# Patient Record
Sex: Female | Born: 1940
Health system: Southern US, Community
[De-identification: ages and names within clinical notes are randomized; demographics above are authoritative.]

## PROBLEM LIST (undated history)

## (undated) DIAGNOSIS — I10 Essential (primary) hypertension: Secondary | ICD-10-CM

## (undated) DIAGNOSIS — E039 Hypothyroidism, unspecified: Secondary | ICD-10-CM

## (undated) DIAGNOSIS — F329 Major depressive disorder, single episode, unspecified: Secondary | ICD-10-CM

## (undated) DIAGNOSIS — N3281 Overactive bladder: Secondary | ICD-10-CM

## (undated) DIAGNOSIS — K219 Gastro-esophageal reflux disease without esophagitis: Secondary | ICD-10-CM

## (undated) DIAGNOSIS — D72819 Decreased white blood cell count, unspecified: Secondary | ICD-10-CM

## (undated) DIAGNOSIS — E538 Deficiency of other specified B group vitamins: Secondary | ICD-10-CM

## (undated) DIAGNOSIS — F419 Anxiety disorder, unspecified: Secondary | ICD-10-CM

## (undated) DIAGNOSIS — D509 Iron deficiency anemia, unspecified: Secondary | ICD-10-CM

## (undated) DIAGNOSIS — M1990 Unspecified osteoarthritis, unspecified site: Secondary | ICD-10-CM

## (undated) DIAGNOSIS — R42 Dizziness and giddiness: Secondary | ICD-10-CM

## (undated) DIAGNOSIS — E079 Disorder of thyroid, unspecified: Secondary | ICD-10-CM

## (undated) DIAGNOSIS — K2100 Gastro-esophageal reflux disease with esophagitis, without bleeding: Secondary | ICD-10-CM

## (undated) HISTORY — PX: KNEE SURGERY: SHX244

## (undated) HISTORY — DX: Essential (primary) hypertension: I10

## (undated) HISTORY — DX: Major depressive disorder, single episode, unspecified: F32.9

## (undated) HISTORY — DX: Deficiency of other specified B group vitamins: E53.8

## (undated) HISTORY — DX: Dizziness and giddiness: R42

## (undated) HISTORY — PX: VAGINAL HYSTERECTOMY: SHX2639

## (undated) HISTORY — PX: REDUCTION MAMMAPLASTY: SUR839

## (undated) HISTORY — PX: APPENDECTOMY: SHX54

## (undated) HISTORY — PX: DIAGNOSTIC LAPAROSCOPY: SUR761

## (undated) HISTORY — DX: Gastro-esophageal reflux disease with esophagitis, without bleeding: K21.00

## (undated) HISTORY — DX: Overactive bladder: N32.81

## (undated) HISTORY — PX: EYE SURGERY: SHX253

## (undated) HISTORY — DX: Iron deficiency anemia, unspecified: D50.9

## (undated) HISTORY — PX: TONSILLECTOMY: SUR1361

## (undated) HISTORY — DX: Decreased white blood cell count, unspecified: D72.819

---

## 1988-01-28 HISTORY — PX: REDUCTION MAMMAPLASTY: SUR839

## 2005-01-27 HISTORY — PX: HIATAL HERNIA REPAIR: SHX195

## 2007-01-28 HISTORY — PX: BACK SURGERY: SHX140

## 2017-02-26 ENCOUNTER — Other Ambulatory Visit (HOSPITAL_COMMUNITY): Payer: Self-pay | Admitting: Internal Medicine

## 2017-02-26 DIAGNOSIS — Z1231 Encounter for screening mammogram for malignant neoplasm of breast: Secondary | ICD-10-CM

## 2017-02-26 DIAGNOSIS — Z780 Asymptomatic menopausal state: Secondary | ICD-10-CM

## 2017-03-09 ENCOUNTER — Ambulatory Visit (HOSPITAL_COMMUNITY)
Admission: RE | Admit: 2017-03-09 | Discharge: 2017-03-09 | Disposition: A | Discharge status: Home / Self Care | Payer: 59 | Source: Ambulatory Visit | Attending: Internal Medicine | Admitting: Internal Medicine

## 2017-03-09 ENCOUNTER — Encounter (HOSPITAL_COMMUNITY): Payer: Self-pay | Admitting: Radiology

## 2017-03-09 DIAGNOSIS — Z780 Asymptomatic menopausal state: Secondary | ICD-10-CM | POA: Diagnosis present

## 2017-03-09 DIAGNOSIS — M85851 Other specified disorders of bone density and structure, right thigh: Secondary | ICD-10-CM | POA: Diagnosis not present

## 2017-03-09 DIAGNOSIS — Z1231 Encounter for screening mammogram for malignant neoplasm of breast: Secondary | ICD-10-CM | POA: Diagnosis not present

## 2017-10-06 ENCOUNTER — Ambulatory Visit (HOSPITAL_COMMUNITY)
Admission: RE | Admit: 2017-10-06 | Discharge: 2017-10-06 | Disposition: A | Discharge status: Home / Self Care | Payer: 59 | Source: Ambulatory Visit | Attending: Adult Health Nurse Practitioner | Admitting: Adult Health Nurse Practitioner

## 2017-10-06 ENCOUNTER — Other Ambulatory Visit (HOSPITAL_COMMUNITY): Payer: Self-pay | Admitting: Adult Health Nurse Practitioner

## 2017-10-06 DIAGNOSIS — M11262 Other chondrocalcinosis, left knee: Secondary | ICD-10-CM | POA: Insufficient documentation

## 2017-10-06 DIAGNOSIS — M25562 Pain in left knee: Secondary | ICD-10-CM

## 2017-10-06 DIAGNOSIS — M1712 Unilateral primary osteoarthritis, left knee: Secondary | ICD-10-CM | POA: Diagnosis not present

## 2017-10-06 DIAGNOSIS — M7122 Synovial cyst of popliteal space [Baker], left knee: Secondary | ICD-10-CM | POA: Diagnosis present

## 2018-01-13 ENCOUNTER — Other Ambulatory Visit (HOSPITAL_COMMUNITY): Payer: Self-pay | Admitting: Adult Health Nurse Practitioner

## 2018-01-13 ENCOUNTER — Ambulatory Visit (HOSPITAL_COMMUNITY)
Admission: RE | Admit: 2018-01-13 | Discharge: 2018-01-13 | Disposition: A | Discharge status: Home / Self Care | Payer: 59 | Source: Ambulatory Visit | Attending: Adult Health Nurse Practitioner | Admitting: Adult Health Nurse Practitioner

## 2018-01-13 DIAGNOSIS — R05 Cough: Secondary | ICD-10-CM | POA: Diagnosis present

## 2018-01-13 DIAGNOSIS — R059 Cough, unspecified: Secondary | ICD-10-CM

## 2018-01-28 ENCOUNTER — Ambulatory Visit (HOSPITAL_COMMUNITY)
Admission: RE | Admit: 2018-01-28 | Discharge: 2018-01-28 | Disposition: A | Discharge status: Home / Self Care | Payer: 59 | Source: Ambulatory Visit | Attending: Adult Health Nurse Practitioner | Admitting: Adult Health Nurse Practitioner

## 2018-01-28 ENCOUNTER — Other Ambulatory Visit (HOSPITAL_COMMUNITY): Payer: Self-pay | Admitting: Adult Health Nurse Practitioner

## 2018-01-28 DIAGNOSIS — J189 Pneumonia, unspecified organism: Secondary | ICD-10-CM | POA: Insufficient documentation

## 2018-02-04 ENCOUNTER — Other Ambulatory Visit (HOSPITAL_COMMUNITY): Payer: Self-pay | Admitting: Adult Health Nurse Practitioner

## 2018-02-04 DIAGNOSIS — Z1231 Encounter for screening mammogram for malignant neoplasm of breast: Secondary | ICD-10-CM

## 2018-03-10 ENCOUNTER — Ambulatory Visit (HOSPITAL_COMMUNITY)
Admission: RE | Admit: 2018-03-10 | Discharge: 2018-03-10 | Disposition: A | Discharge status: Home / Self Care | Payer: 59 | Source: Ambulatory Visit | Attending: Adult Health Nurse Practitioner | Admitting: Adult Health Nurse Practitioner

## 2018-03-10 DIAGNOSIS — Z1231 Encounter for screening mammogram for malignant neoplasm of breast: Secondary | ICD-10-CM

## 2018-07-13 DIAGNOSIS — E039 Hypothyroidism, unspecified: Secondary | ICD-10-CM | POA: Diagnosis not present

## 2018-07-13 DIAGNOSIS — E782 Mixed hyperlipidemia: Secondary | ICD-10-CM | POA: Diagnosis not present

## 2018-08-10 DIAGNOSIS — H26491 Other secondary cataract, right eye: Secondary | ICD-10-CM | POA: Diagnosis not present

## 2018-08-18 DIAGNOSIS — K219 Gastro-esophageal reflux disease without esophagitis: Secondary | ICD-10-CM | POA: Diagnosis not present

## 2018-08-18 DIAGNOSIS — E785 Hyperlipidemia, unspecified: Secondary | ICD-10-CM | POA: Diagnosis not present

## 2018-08-18 DIAGNOSIS — E039 Hypothyroidism, unspecified: Secondary | ICD-10-CM | POA: Diagnosis not present

## 2018-09-13 DIAGNOSIS — R030 Elevated blood-pressure reading, without diagnosis of hypertension: Secondary | ICD-10-CM | POA: Diagnosis not present

## 2018-09-13 DIAGNOSIS — E039 Hypothyroidism, unspecified: Secondary | ICD-10-CM | POA: Diagnosis not present

## 2018-09-13 DIAGNOSIS — I10 Essential (primary) hypertension: Secondary | ICD-10-CM | POA: Diagnosis not present

## 2018-09-13 DIAGNOSIS — E782 Mixed hyperlipidemia: Secondary | ICD-10-CM | POA: Diagnosis not present

## 2018-09-13 DIAGNOSIS — E785 Hyperlipidemia, unspecified: Secondary | ICD-10-CM | POA: Diagnosis not present

## 2018-09-17 DIAGNOSIS — E039 Hypothyroidism, unspecified: Secondary | ICD-10-CM | POA: Diagnosis not present

## 2018-09-17 DIAGNOSIS — E785 Hyperlipidemia, unspecified: Secondary | ICD-10-CM | POA: Diagnosis not present

## 2018-09-17 DIAGNOSIS — K219 Gastro-esophageal reflux disease without esophagitis: Secondary | ICD-10-CM | POA: Diagnosis not present

## 2018-09-21 DIAGNOSIS — K219 Gastro-esophageal reflux disease without esophagitis: Secondary | ICD-10-CM | POA: Diagnosis not present

## 2018-09-21 DIAGNOSIS — E782 Mixed hyperlipidemia: Secondary | ICD-10-CM | POA: Diagnosis not present

## 2018-09-21 DIAGNOSIS — E039 Hypothyroidism, unspecified: Secondary | ICD-10-CM | POA: Diagnosis not present

## 2018-09-21 DIAGNOSIS — I10 Essential (primary) hypertension: Secondary | ICD-10-CM | POA: Diagnosis not present

## 2018-09-21 DIAGNOSIS — Z0001 Encounter for general adult medical examination with abnormal findings: Secondary | ICD-10-CM | POA: Diagnosis not present

## 2018-09-27 DIAGNOSIS — M25562 Pain in left knee: Secondary | ICD-10-CM | POA: Diagnosis not present

## 2018-11-02 DIAGNOSIS — E785 Hyperlipidemia, unspecified: Secondary | ICD-10-CM | POA: Diagnosis not present

## 2018-11-02 DIAGNOSIS — E039 Hypothyroidism, unspecified: Secondary | ICD-10-CM | POA: Diagnosis not present

## 2018-11-02 DIAGNOSIS — K219 Gastro-esophageal reflux disease without esophagitis: Secondary | ICD-10-CM | POA: Diagnosis not present

## 2018-11-18 DIAGNOSIS — H26493 Other secondary cataract, bilateral: Secondary | ICD-10-CM | POA: Diagnosis not present

## 2018-11-18 DIAGNOSIS — Z961 Presence of intraocular lens: Secondary | ICD-10-CM | POA: Diagnosis not present

## 2018-11-18 DIAGNOSIS — H26492 Other secondary cataract, left eye: Secondary | ICD-10-CM | POA: Diagnosis not present

## 2019-01-05 DIAGNOSIS — H5213 Myopia, bilateral: Secondary | ICD-10-CM | POA: Diagnosis not present

## 2019-01-11 DIAGNOSIS — E039 Hypothyroidism, unspecified: Secondary | ICD-10-CM | POA: Diagnosis not present

## 2019-01-11 DIAGNOSIS — M25562 Pain in left knee: Secondary | ICD-10-CM | POA: Diagnosis not present

## 2019-01-24 DIAGNOSIS — H524 Presbyopia: Secondary | ICD-10-CM | POA: Diagnosis not present

## 2019-01-24 DIAGNOSIS — H52223 Regular astigmatism, bilateral: Secondary | ICD-10-CM | POA: Diagnosis not present

## 2019-01-31 DIAGNOSIS — E785 Hyperlipidemia, unspecified: Secondary | ICD-10-CM | POA: Diagnosis not present

## 2019-03-01 ENCOUNTER — Other Ambulatory Visit (HOSPITAL_COMMUNITY): Payer: Self-pay | Admitting: Adult Health Nurse Practitioner

## 2019-03-01 DIAGNOSIS — Z1231 Encounter for screening mammogram for malignant neoplasm of breast: Secondary | ICD-10-CM

## 2019-03-02 ENCOUNTER — Other Ambulatory Visit (HOSPITAL_COMMUNITY): Payer: Self-pay | Admitting: Internal Medicine

## 2019-03-02 DIAGNOSIS — Z13820 Encounter for screening for osteoporosis: Secondary | ICD-10-CM

## 2019-03-16 ENCOUNTER — Ambulatory Visit (HOSPITAL_COMMUNITY)
Admission: RE | Admit: 2019-03-16 | Discharge: 2019-03-16 | Disposition: A | Discharge status: Home / Self Care | Payer: Medicare Other | Source: Ambulatory Visit | Attending: Internal Medicine | Admitting: Internal Medicine

## 2019-03-16 ENCOUNTER — Ambulatory Visit (HOSPITAL_COMMUNITY)
Admission: RE | Admit: 2019-03-16 | Discharge: 2019-03-16 | Disposition: A | Discharge status: Home / Self Care | Payer: Medicare Other | Source: Ambulatory Visit | Attending: Adult Health Nurse Practitioner | Admitting: Adult Health Nurse Practitioner

## 2019-03-16 ENCOUNTER — Other Ambulatory Visit: Payer: Self-pay

## 2019-03-16 DIAGNOSIS — Z780 Asymptomatic menopausal state: Secondary | ICD-10-CM | POA: Diagnosis not present

## 2019-03-16 DIAGNOSIS — M8589 Other specified disorders of bone density and structure, multiple sites: Secondary | ICD-10-CM | POA: Diagnosis not present

## 2019-03-16 DIAGNOSIS — Z13820 Encounter for screening for osteoporosis: Secondary | ICD-10-CM

## 2019-03-16 DIAGNOSIS — Z1231 Encounter for screening mammogram for malignant neoplasm of breast: Secondary | ICD-10-CM | POA: Insufficient documentation

## 2019-03-21 DIAGNOSIS — Z1321 Encounter for screening for nutritional disorder: Secondary | ICD-10-CM | POA: Diagnosis not present

## 2019-03-21 DIAGNOSIS — E039 Hypothyroidism, unspecified: Secondary | ICD-10-CM | POA: Diagnosis not present

## 2019-03-21 DIAGNOSIS — E782 Mixed hyperlipidemia: Secondary | ICD-10-CM | POA: Diagnosis not present

## 2019-03-21 DIAGNOSIS — Z0001 Encounter for general adult medical examination with abnormal findings: Secondary | ICD-10-CM | POA: Diagnosis not present

## 2019-03-21 DIAGNOSIS — R030 Elevated blood-pressure reading, without diagnosis of hypertension: Secondary | ICD-10-CM | POA: Diagnosis not present

## 2019-03-22 DIAGNOSIS — E782 Mixed hyperlipidemia: Secondary | ICD-10-CM | POA: Diagnosis not present

## 2019-03-22 DIAGNOSIS — Z0001 Encounter for general adult medical examination with abnormal findings: Secondary | ICD-10-CM | POA: Diagnosis not present

## 2019-03-22 DIAGNOSIS — K219 Gastro-esophageal reflux disease without esophagitis: Secondary | ICD-10-CM | POA: Diagnosis not present

## 2019-03-22 DIAGNOSIS — E039 Hypothyroidism, unspecified: Secondary | ICD-10-CM | POA: Diagnosis not present

## 2019-03-22 DIAGNOSIS — I10 Essential (primary) hypertension: Secondary | ICD-10-CM | POA: Diagnosis not present

## 2019-04-21 DIAGNOSIS — D519 Vitamin B12 deficiency anemia, unspecified: Secondary | ICD-10-CM | POA: Diagnosis not present

## 2019-05-10 DIAGNOSIS — K219 Gastro-esophageal reflux disease without esophagitis: Secondary | ICD-10-CM | POA: Diagnosis not present

## 2019-05-10 DIAGNOSIS — E785 Hyperlipidemia, unspecified: Secondary | ICD-10-CM | POA: Diagnosis not present

## 2019-05-10 DIAGNOSIS — E039 Hypothyroidism, unspecified: Secondary | ICD-10-CM | POA: Diagnosis not present

## 2019-05-23 DIAGNOSIS — D519 Vitamin B12 deficiency anemia, unspecified: Secondary | ICD-10-CM | POA: Diagnosis not present

## 2019-06-16 DIAGNOSIS — D519 Vitamin B12 deficiency anemia, unspecified: Secondary | ICD-10-CM | POA: Diagnosis not present

## 2019-07-04 DIAGNOSIS — E785 Hyperlipidemia, unspecified: Secondary | ICD-10-CM | POA: Diagnosis not present

## 2019-07-04 DIAGNOSIS — E039 Hypothyroidism, unspecified: Secondary | ICD-10-CM | POA: Diagnosis not present

## 2019-07-04 DIAGNOSIS — E782 Mixed hyperlipidemia: Secondary | ICD-10-CM | POA: Diagnosis not present

## 2019-07-04 DIAGNOSIS — D519 Vitamin B12 deficiency anemia, unspecified: Secondary | ICD-10-CM | POA: Diagnosis not present

## 2019-07-20 DIAGNOSIS — E039 Hypothyroidism, unspecified: Secondary | ICD-10-CM | POA: Diagnosis not present

## 2019-07-20 DIAGNOSIS — E782 Mixed hyperlipidemia: Secondary | ICD-10-CM | POA: Diagnosis not present

## 2019-07-20 DIAGNOSIS — E559 Vitamin D deficiency, unspecified: Secondary | ICD-10-CM | POA: Diagnosis not present

## 2019-07-20 DIAGNOSIS — D519 Vitamin B12 deficiency anemia, unspecified: Secondary | ICD-10-CM | POA: Diagnosis not present

## 2019-07-20 DIAGNOSIS — E785 Hyperlipidemia, unspecified: Secondary | ICD-10-CM | POA: Diagnosis not present

## 2019-07-20 DIAGNOSIS — R5383 Other fatigue: Secondary | ICD-10-CM | POA: Diagnosis not present

## 2019-07-28 DIAGNOSIS — E785 Hyperlipidemia, unspecified: Secondary | ICD-10-CM | POA: Diagnosis not present

## 2019-07-28 DIAGNOSIS — K219 Gastro-esophageal reflux disease without esophagitis: Secondary | ICD-10-CM | POA: Diagnosis not present

## 2019-07-28 DIAGNOSIS — E039 Hypothyroidism, unspecified: Secondary | ICD-10-CM | POA: Diagnosis not present

## 2019-08-15 DIAGNOSIS — H02834 Dermatochalasis of left upper eyelid: Secondary | ICD-10-CM | POA: Diagnosis not present

## 2019-08-15 DIAGNOSIS — Z961 Presence of intraocular lens: Secondary | ICD-10-CM | POA: Diagnosis not present

## 2019-08-15 DIAGNOSIS — H26491 Other secondary cataract, right eye: Secondary | ICD-10-CM | POA: Diagnosis not present

## 2019-08-15 DIAGNOSIS — H02831 Dermatochalasis of right upper eyelid: Secondary | ICD-10-CM | POA: Diagnosis not present

## 2019-09-19 DIAGNOSIS — N819 Female genital prolapse, unspecified: Secondary | ICD-10-CM | POA: Diagnosis not present

## 2019-09-19 DIAGNOSIS — E782 Mixed hyperlipidemia: Secondary | ICD-10-CM | POA: Diagnosis not present

## 2019-09-19 DIAGNOSIS — R7303 Prediabetes: Secondary | ICD-10-CM | POA: Diagnosis not present

## 2019-09-19 DIAGNOSIS — R030 Elevated blood-pressure reading, without diagnosis of hypertension: Secondary | ICD-10-CM | POA: Diagnosis not present

## 2019-09-21 DIAGNOSIS — I10 Essential (primary) hypertension: Secondary | ICD-10-CM | POA: Diagnosis not present

## 2019-09-21 DIAGNOSIS — K219 Gastro-esophageal reflux disease without esophagitis: Secondary | ICD-10-CM | POA: Diagnosis not present

## 2019-09-21 DIAGNOSIS — E039 Hypothyroidism, unspecified: Secondary | ICD-10-CM | POA: Diagnosis not present

## 2019-09-21 DIAGNOSIS — E782 Mixed hyperlipidemia: Secondary | ICD-10-CM | POA: Diagnosis not present

## 2019-10-05 DIAGNOSIS — H02831 Dermatochalasis of right upper eyelid: Secondary | ICD-10-CM | POA: Diagnosis not present

## 2019-10-05 DIAGNOSIS — H02834 Dermatochalasis of left upper eyelid: Secondary | ICD-10-CM | POA: Diagnosis not present

## 2019-10-17 DIAGNOSIS — M25562 Pain in left knee: Secondary | ICD-10-CM | POA: Diagnosis not present

## 2019-10-31 DIAGNOSIS — E7849 Other hyperlipidemia: Secondary | ICD-10-CM | POA: Diagnosis not present

## 2019-10-31 DIAGNOSIS — E039 Hypothyroidism, unspecified: Secondary | ICD-10-CM | POA: Diagnosis not present

## 2019-10-31 DIAGNOSIS — K219 Gastro-esophageal reflux disease without esophagitis: Secondary | ICD-10-CM | POA: Diagnosis not present

## 2019-11-29 DIAGNOSIS — E039 Hypothyroidism, unspecified: Secondary | ICD-10-CM | POA: Diagnosis not present

## 2019-11-29 DIAGNOSIS — E7849 Other hyperlipidemia: Secondary | ICD-10-CM | POA: Diagnosis not present

## 2019-11-29 DIAGNOSIS — E785 Hyperlipidemia, unspecified: Secondary | ICD-10-CM | POA: Diagnosis not present

## 2019-11-29 DIAGNOSIS — K219 Gastro-esophageal reflux disease without esophagitis: Secondary | ICD-10-CM | POA: Diagnosis not present

## 2019-12-27 ENCOUNTER — Other Ambulatory Visit (HOSPITAL_COMMUNITY): Payer: Self-pay

## 2019-12-27 DIAGNOSIS — U071 COVID-19: Secondary | ICD-10-CM | POA: Diagnosis not present

## 2019-12-27 DIAGNOSIS — J040 Acute laryngitis: Secondary | ICD-10-CM | POA: Diagnosis not present

## 2019-12-28 ENCOUNTER — Telehealth: Payer: Self-pay | Admitting: Oncology

## 2019-12-28 NOTE — Telephone Encounter (Signed)
Called to Discuss with patient about Covid symptoms and the use of the monoclonal antibody infusion for those with mild to moderate Covid symptoms and at a high risk of hospitalization.     Pt is qualified for this infusion due to co-morbid conditions and/or a member of an at-risk group.    Mrs. Couchman is 79 years old.  There are no problems to display for this patient.  Patient declines infusion at this time. Symptoms tier reviewed as well as criteria for ending isolation. Preventative practices reviewed. Patient verbalized understanding.   Mrs. Nusbaum tested positive from rapid test at Dr. Scharlene Gloss office and [place].  She states she feels pretty good and only has laryngitis.  She does not believe she actually has COVID-19 and would like to retest.  We spoke at length about the monoclonal antibody infusion as well as the COVID-19 virus.  She will call clinic back with any questions and results of her second COVID-19 test.  Patient advised to call back if he/she decides that he/she does want to get infusion. Callback number to the infusion center given. Patient advised to go to Urgent care or ED with severe symptoms.   Durenda Hurt, NP 12/28/2019 12:25 PM

## 2019-12-30 DIAGNOSIS — U071 COVID-19: Secondary | ICD-10-CM | POA: Diagnosis not present

## 2019-12-30 DIAGNOSIS — J040 Acute laryngitis: Secondary | ICD-10-CM | POA: Diagnosis not present

## 2020-01-04 DIAGNOSIS — J040 Acute laryngitis: Secondary | ICD-10-CM | POA: Diagnosis not present

## 2020-01-10 DIAGNOSIS — N3281 Overactive bladder: Secondary | ICD-10-CM | POA: Diagnosis not present

## 2020-01-10 DIAGNOSIS — R531 Weakness: Secondary | ICD-10-CM | POA: Diagnosis not present

## 2020-01-10 DIAGNOSIS — Z8616 Personal history of COVID-19: Secondary | ICD-10-CM | POA: Diagnosis not present

## 2020-01-17 DIAGNOSIS — Z719 Counseling, unspecified: Secondary | ICD-10-CM | POA: Diagnosis not present

## 2020-01-27 DIAGNOSIS — E7849 Other hyperlipidemia: Secondary | ICD-10-CM | POA: Diagnosis not present

## 2020-01-27 DIAGNOSIS — E785 Hyperlipidemia, unspecified: Secondary | ICD-10-CM | POA: Diagnosis not present

## 2020-01-27 DIAGNOSIS — K219 Gastro-esophageal reflux disease without esophagitis: Secondary | ICD-10-CM | POA: Diagnosis not present

## 2020-01-27 DIAGNOSIS — E039 Hypothyroidism, unspecified: Secondary | ICD-10-CM | POA: Diagnosis not present

## 2020-01-30 DIAGNOSIS — H02831 Dermatochalasis of right upper eyelid: Secondary | ICD-10-CM | POA: Diagnosis not present

## 2020-01-30 DIAGNOSIS — H02834 Dermatochalasis of left upper eyelid: Secondary | ICD-10-CM | POA: Diagnosis not present

## 2020-02-06 ENCOUNTER — Other Ambulatory Visit (HOSPITAL_COMMUNITY): Payer: Self-pay | Admitting: Internal Medicine

## 2020-02-06 DIAGNOSIS — Z1231 Encounter for screening mammogram for malignant neoplasm of breast: Secondary | ICD-10-CM

## 2020-02-25 DIAGNOSIS — E7849 Other hyperlipidemia: Secondary | ICD-10-CM | POA: Diagnosis not present

## 2020-02-25 DIAGNOSIS — K219 Gastro-esophageal reflux disease without esophagitis: Secondary | ICD-10-CM | POA: Diagnosis not present

## 2020-02-25 DIAGNOSIS — E785 Hyperlipidemia, unspecified: Secondary | ICD-10-CM | POA: Diagnosis not present

## 2020-03-19 ENCOUNTER — Ambulatory Visit (HOSPITAL_COMMUNITY): Payer: Medicare Other

## 2020-03-26 DIAGNOSIS — E785 Hyperlipidemia, unspecified: Secondary | ICD-10-CM | POA: Diagnosis not present

## 2020-03-26 DIAGNOSIS — E039 Hypothyroidism, unspecified: Secondary | ICD-10-CM | POA: Diagnosis not present

## 2020-03-26 DIAGNOSIS — K219 Gastro-esophageal reflux disease without esophagitis: Secondary | ICD-10-CM | POA: Diagnosis not present

## 2020-04-25 DIAGNOSIS — E785 Hyperlipidemia, unspecified: Secondary | ICD-10-CM | POA: Diagnosis not present

## 2020-04-25 DIAGNOSIS — K219 Gastro-esophageal reflux disease without esophagitis: Secondary | ICD-10-CM | POA: Diagnosis not present

## 2020-04-25 DIAGNOSIS — E039 Hypothyroidism, unspecified: Secondary | ICD-10-CM | POA: Diagnosis not present

## 2020-04-26 ENCOUNTER — Other Ambulatory Visit: Payer: Self-pay

## 2020-04-26 ENCOUNTER — Ambulatory Visit (HOSPITAL_COMMUNITY)
Admission: RE | Admit: 2020-04-26 | Discharge: 2020-04-26 | Disposition: A | Discharge status: Home / Self Care | Payer: Medicare Other | Source: Ambulatory Visit | Attending: Internal Medicine | Admitting: Internal Medicine

## 2020-04-26 DIAGNOSIS — Z1231 Encounter for screening mammogram for malignant neoplasm of breast: Secondary | ICD-10-CM | POA: Diagnosis not present

## 2020-05-30 ENCOUNTER — Other Ambulatory Visit (HOSPITAL_COMMUNITY): Payer: Self-pay | Admitting: Adult Health Nurse Practitioner

## 2020-05-30 ENCOUNTER — Other Ambulatory Visit: Payer: Self-pay

## 2020-05-30 ENCOUNTER — Ambulatory Visit (HOSPITAL_COMMUNITY)
Admission: RE | Admit: 2020-05-30 | Discharge: 2020-05-30 | Disposition: A | Discharge status: Home / Self Care | Payer: Medicare Other | Source: Ambulatory Visit | Attending: Adult Health Nurse Practitioner | Admitting: Adult Health Nurse Practitioner

## 2020-05-30 DIAGNOSIS — M62830 Muscle spasm of back: Secondary | ICD-10-CM

## 2020-05-30 DIAGNOSIS — R7303 Prediabetes: Secondary | ICD-10-CM | POA: Diagnosis not present

## 2020-05-30 DIAGNOSIS — M546 Pain in thoracic spine: Secondary | ICD-10-CM | POA: Diagnosis not present

## 2020-05-30 DIAGNOSIS — E538 Deficiency of other specified B group vitamins: Secondary | ICD-10-CM | POA: Diagnosis not present

## 2020-05-30 DIAGNOSIS — E782 Mixed hyperlipidemia: Secondary | ICD-10-CM | POA: Diagnosis not present

## 2020-05-30 DIAGNOSIS — E039 Hypothyroidism, unspecified: Secondary | ICD-10-CM | POA: Diagnosis not present

## 2020-05-30 DIAGNOSIS — I10 Essential (primary) hypertension: Secondary | ICD-10-CM | POA: Diagnosis not present

## 2020-08-21 DIAGNOSIS — R5081 Fever presenting with conditions classified elsewhere: Secondary | ICD-10-CM | POA: Diagnosis not present

## 2020-08-21 DIAGNOSIS — I10 Essential (primary) hypertension: Secondary | ICD-10-CM | POA: Diagnosis not present

## 2020-08-21 DIAGNOSIS — M2559 Pain in other specified joint: Secondary | ICD-10-CM | POA: Diagnosis not present

## 2020-08-21 DIAGNOSIS — R531 Weakness: Secondary | ICD-10-CM | POA: Diagnosis not present

## 2020-08-21 DIAGNOSIS — R5383 Other fatigue: Secondary | ICD-10-CM | POA: Diagnosis not present

## 2020-08-21 DIAGNOSIS — Z20822 Contact with and (suspected) exposure to covid-19: Secondary | ICD-10-CM | POA: Diagnosis not present

## 2020-08-21 DIAGNOSIS — Z7689 Persons encountering health services in other specified circumstances: Secondary | ICD-10-CM | POA: Diagnosis not present

## 2020-08-21 DIAGNOSIS — K219 Gastro-esophageal reflux disease without esophagitis: Secondary | ICD-10-CM | POA: Diagnosis not present

## 2020-08-21 DIAGNOSIS — R6889 Other general symptoms and signs: Secondary | ICD-10-CM | POA: Diagnosis not present

## 2020-09-11 DIAGNOSIS — I10 Essential (primary) hypertension: Secondary | ICD-10-CM | POA: Diagnosis not present

## 2020-09-11 DIAGNOSIS — K219 Gastro-esophageal reflux disease without esophagitis: Secondary | ICD-10-CM | POA: Diagnosis not present

## 2020-09-11 DIAGNOSIS — D508 Other iron deficiency anemias: Secondary | ICD-10-CM | POA: Diagnosis not present

## 2020-09-11 DIAGNOSIS — R5383 Other fatigue: Secondary | ICD-10-CM | POA: Diagnosis not present

## 2020-09-11 DIAGNOSIS — M21611 Bunion of right foot: Secondary | ICD-10-CM | POA: Diagnosis not present

## 2020-09-20 ENCOUNTER — Encounter: Payer: Self-pay | Admitting: Podiatry

## 2020-09-20 ENCOUNTER — Other Ambulatory Visit: Payer: Self-pay

## 2020-09-20 ENCOUNTER — Ambulatory Visit (INDEPENDENT_AMBULATORY_CARE_PROVIDER_SITE_OTHER): Payer: Medicare Other | Admitting: Podiatry

## 2020-09-20 ENCOUNTER — Ambulatory Visit (INDEPENDENT_AMBULATORY_CARE_PROVIDER_SITE_OTHER): Payer: Medicare Other

## 2020-09-20 DIAGNOSIS — L600 Ingrowing nail: Secondary | ICD-10-CM | POA: Diagnosis not present

## 2020-09-20 DIAGNOSIS — M216X1 Other acquired deformities of right foot: Secondary | ICD-10-CM

## 2020-09-20 DIAGNOSIS — M216X2 Other acquired deformities of left foot: Secondary | ICD-10-CM | POA: Diagnosis not present

## 2020-09-20 DIAGNOSIS — M21619 Bunion of unspecified foot: Secondary | ICD-10-CM

## 2020-09-20 DIAGNOSIS — M205X1 Other deformities of toe(s) (acquired), right foot: Secondary | ICD-10-CM

## 2020-09-20 NOTE — Patient Instructions (Signed)

## 2020-09-20 NOTE — Progress Notes (Signed)
Subjective:   Patient ID: Kathleen Keller, female   DOB: 80 y.o.   MRN: [id number]   HPI Patient presents stating  She is having a lot of pain of the big toe joint right with bunion deformity ingrown toenail deformity left great toe over right big toe stating it sore and makes it hard to walk.  Patient states she is tried wider shoes she is tried soaks and other modalities and the bunion problem has been continuous with the ingrown toenail.  Patient does not smoke likes to be active    Review of Systems  All other systems reviewed and are negative.      Objective:  Physical Exam Vitals and nursing note reviewed.  Constitutional:      Appearance: She is well-developed.  Pulmonary:     Effort: Pulmonary effort is normal.  Musculoskeletal:        General: Normal range of motion.  Skin:    General: Skin is warm.  Neurological:     Mental Status: She is alert.    Neurovascular status intact muscle strength was found to be adequate range of motion adequate.  Patient has prominence of the first metatarsal head right with redness around the joint along with reduced range of motion spurring dorsally and discomfort of the joint surface with no crepitus.  Left shows an incurvated medial border painful when pressed make shoe gear difficult and has tried trimming it and soaking it.  Good digital perfusion well oriented x3     Assessment:  Structural HAV hallux limitus deformity right with prominence and reduced motion with pain along with ingrown toenail deformity left hallux     Plan:  H&P educated on both different conditions and discussed correction of the ingrown toenail today which she wants done.  I allowed her to read consent form she signed I infiltrated the left hallux 60 mg like Marcaine mixture under sterile conditions remove the medial border exposed matrix and applied phenol 3 applications 30 seconds followed by alcohol lavage sterile dressing.  For the right I discussed the bunion  deformity I recommended correction she wants to get it done since she has to drive quite a bit distance she wants to do consent form today.  I allowed her to sign consent form explaining procedure risk and patient scheduled for outpatient surgery after reviewing alternative treatments complications.  She will have a biplanar osteotomy first metatarsal right with removal of bone spur formation and removal of the medial eminence and is scheduled an air fracture walker dispensed that I want her to get used to prior to surgery and will bring with her and is encouraged to call questions concerns.  Total recovery to take 6 months ultimate fusion of the first metatarsal phalangeal joint or implant cannot be ruled out  X-rays indicate there is spurring of the dorsal first metatarsal elevation intermetatarsal angle approximately 15 degrees with medial eminence.  Moderate reduced range of motion and joint space first MPJ

## 2020-09-24 ENCOUNTER — Telehealth: Payer: Self-pay | Admitting: Urology

## 2020-09-24 NOTE — Telephone Encounter (Signed)
DOS - 10/09/20  [place] BUNIONECTOMY RIGHT --- [phone]   [facility] EFFECTIVE DATE - 01/28/20   PLAN DEDUCTIBLE - $0.00 OUT OF POCKET - $7,550.00 W/ $7,550.00 REMAINING COINSURANCE - 20% COPAY - $0.00   SPOKE WITH MARY WITH [employer] AND SHE STATED THAT FOR CPT CODE 92119 NO PRIOR AUTH IS REQUIRED.   REF # [id number]

## 2020-10-04 ENCOUNTER — Encounter (HOSPITAL_COMMUNITY)
Admission: RE | Admit: 2020-10-04 | Discharge: 2020-10-04 | Disposition: A | Discharge status: Home / Self Care | Payer: Medicare Other | Source: Ambulatory Visit | Attending: Adult Health Nurse Practitioner | Admitting: Adult Health Nurse Practitioner

## 2020-10-04 ENCOUNTER — Other Ambulatory Visit: Payer: Self-pay

## 2020-10-04 DIAGNOSIS — D509 Iron deficiency anemia, unspecified: Secondary | ICD-10-CM | POA: Insufficient documentation

## 2020-10-04 MED ORDER — SODIUM CHLORIDE 0.9 % IV SOLN: INTRAVENOUS | Status: DC

## 2020-10-04 MED ORDER — SODIUM CHLORIDE 0.9 % IV SOLN
750.0000 mg | Freq: Once | INTRAVENOUS | Status: AC
  Administered 2020-10-04: 750 mg via INTRAVENOUS
  Filled 2020-10-04: qty 15

## 2020-10-04 NOTE — Progress Notes (Signed)
Tolerated injectafer infusing well. Voices no c/o at this time. D/C to home in good condition.

## 2020-10-08 MED ORDER — ONDANSETRON HCL 4 MG PO TABS: 4.0000 mg | ORAL_TABLET | Freq: Three times a day (TID) | ORAL | 0 refills | Status: DC | PRN

## 2020-10-08 MED ORDER — OXYCODONE-ACETAMINOPHEN 10-325 MG PO TABS: 1.0000 | ORAL_TABLET | ORAL | 0 refills | Status: DC | PRN

## 2020-10-08 NOTE — Addendum Note (Signed)
Addended by: Lenn Sink on: 10/08/2020 05:04 PM   Modules accepted: Orders

## 2020-10-09 ENCOUNTER — Encounter: Payer: Self-pay | Admitting: Podiatry

## 2020-10-09 DIAGNOSIS — M2011 Hallux valgus (acquired), right foot: Secondary | ICD-10-CM | POA: Diagnosis not present

## 2020-10-09 DIAGNOSIS — M2021 Hallux rigidus, right foot: Secondary | ICD-10-CM | POA: Diagnosis not present

## 2020-10-15 ENCOUNTER — Other Ambulatory Visit: Payer: Self-pay

## 2020-10-15 ENCOUNTER — Encounter: Payer: Self-pay | Admitting: Podiatry

## 2020-10-15 ENCOUNTER — Ambulatory Visit (INDEPENDENT_AMBULATORY_CARE_PROVIDER_SITE_OTHER): Payer: Medicare Other

## 2020-10-15 ENCOUNTER — Ambulatory Visit (INDEPENDENT_AMBULATORY_CARE_PROVIDER_SITE_OTHER): Payer: Medicare Other | Admitting: Podiatry

## 2020-10-15 DIAGNOSIS — Z9889 Other specified postprocedural states: Secondary | ICD-10-CM

## 2020-10-16 NOTE — Progress Notes (Signed)
Subjective:   Patient ID: Kathleen Keller, female   DOB: 80 y.o.   MRN: [id number]   HPI Patient states she is doing beautifully with surgery very pleased neuro   ROS      Objective:  Physical Exam  Neurovascular status intact negative Denna Haggard' sign was noted with patient's right first MPJ healing well wound edges well coapted hallux in rectus position     Assessment:  Doing well post osteotomy right first metatarsal     Plan:  H&P reviewed condition reapplied sterile dressing continue immobilization elevation compression begin range of motion exercises reappoint in 3 to 4 weeks or earlier if needed  X-rays indicate osteotomies healing well fixation in place joint congruence

## 2020-11-14 ENCOUNTER — Ambulatory Visit (INDEPENDENT_AMBULATORY_CARE_PROVIDER_SITE_OTHER): Payer: Medicare Other | Admitting: Podiatry

## 2020-11-14 ENCOUNTER — Encounter: Payer: Self-pay | Admitting: Podiatry

## 2020-11-14 ENCOUNTER — Ambulatory Visit (INDEPENDENT_AMBULATORY_CARE_PROVIDER_SITE_OTHER): Payer: Medicare Other

## 2020-11-14 ENCOUNTER — Other Ambulatory Visit: Payer: Self-pay

## 2020-11-14 DIAGNOSIS — Z9889 Other specified postprocedural states: Secondary | ICD-10-CM

## 2020-11-14 NOTE — Progress Notes (Signed)
Subjective:   Patient ID: Kathleen Keller, female   DOB: 80 y.o.   MRN: [id number]   HPI Patient states doing great with surgery very pleased but still have swelling   ROS      Objective:  Physical Exam  Neurovascular status intact negative Denna Haggard' sign noted right first MPJ healing well wound edges well coapted good range of motion no crepitus mild swelling     Assessment:  Doing well post foot surgery right     Plan:  H&P condition reviewed recommended continued elevation compression immobilization and dispensed ankle compression stocking.  Patient's discharge will be seen back as needed  X-rays indicate osteotomies healing well good alignment fixation in place joint congruence

## 2020-11-23 ENCOUNTER — Emergency Department (HOSPITAL_COMMUNITY)
Admission: EM | Admit: 2020-11-23 | Discharge: 2020-11-24 | Disposition: A | Discharge status: Home / Self Care | Payer: Medicare Other | Attending: Emergency Medicine | Admitting: Emergency Medicine

## 2020-11-23 ENCOUNTER — Emergency Department (HOSPITAL_COMMUNITY): Payer: Medicare Other

## 2020-11-23 ENCOUNTER — Encounter (HOSPITAL_COMMUNITY): Payer: Self-pay | Admitting: Emergency Medicine

## 2020-11-23 DIAGNOSIS — R6889 Other general symptoms and signs: Secondary | ICD-10-CM | POA: Diagnosis not present

## 2020-11-23 DIAGNOSIS — I10 Essential (primary) hypertension: Secondary | ICD-10-CM | POA: Insufficient documentation

## 2020-11-23 DIAGNOSIS — R0902 Hypoxemia: Secondary | ICD-10-CM | POA: Diagnosis not present

## 2020-11-23 DIAGNOSIS — Z7982 Long term (current) use of aspirin: Secondary | ICD-10-CM | POA: Diagnosis not present

## 2020-11-23 DIAGNOSIS — Z79899 Other long term (current) drug therapy: Secondary | ICD-10-CM | POA: Diagnosis not present

## 2020-11-23 DIAGNOSIS — Z743 Need for continuous supervision: Secondary | ICD-10-CM | POA: Diagnosis not present

## 2020-11-23 DIAGNOSIS — R55 Syncope and collapse: Secondary | ICD-10-CM | POA: Diagnosis not present

## 2020-11-23 DIAGNOSIS — S01311A Laceration without foreign body of right ear, initial encounter: Secondary | ICD-10-CM | POA: Insufficient documentation

## 2020-11-23 DIAGNOSIS — W1839XA Other fall on same level, initial encounter: Secondary | ICD-10-CM | POA: Diagnosis not present

## 2020-11-23 DIAGNOSIS — S0990XA Unspecified injury of head, initial encounter: Secondary | ICD-10-CM | POA: Diagnosis not present

## 2020-11-23 DIAGNOSIS — W19XXXA Unspecified fall, initial encounter: Secondary | ICD-10-CM

## 2020-11-23 DIAGNOSIS — R58 Hemorrhage, not elsewhere classified: Secondary | ICD-10-CM | POA: Diagnosis not present

## 2020-11-23 HISTORY — DX: Unspecified osteoarthritis, unspecified site: M19.90

## 2020-11-23 HISTORY — DX: Essential (primary) hypertension: I10

## 2020-11-23 HISTORY — DX: Disorder of thyroid, unspecified: E07.9

## 2020-11-23 LAB — BASIC METABOLIC PANEL
Anion gap: 6 (ref 5–15)
BUN: 26 mg/dL — ABNORMAL HIGH (ref 8–23)
CO2: 26 mmol/L (ref 22–32)
Calcium: 9.6 mg/dL (ref 8.9–10.3)
Chloride: 105 mmol/L (ref 98–111)
Creatinine, Ser: 0.75 mg/dL (ref 0.44–1.00)
GFR, Estimated: >60 mL/min (ref >60)
Glucose, Bld: 112 mg/dL — ABNORMAL HIGH (ref 70–99)
Potassium: 4.1 mmol/L (ref 3.5–5.1)
Sodium: 137 mmol/L (ref 135–145)

## 2020-11-23 LAB — CBC
HCT: 39.8 % (ref 36.0–46.0)
Hemoglobin: 12.7 g/dL (ref 12.0–15.0)
MCH: 29.6 pg (ref 26.0–34.0)
MCHC: 31.9 g/dL (ref 30.0–36.0)
MCV: 92.8 fL (ref 80.0–100.0)
Platelets: 277 10*3/uL (ref 150–400)
RBC: 4.29 MIL/uL (ref 3.87–5.11)
RDW: 15.4 % (ref 11.5–15.5)
WBC: 9.4 10*3/uL (ref 4.0–10.5)
nRBC: 0 % (ref 0.0–0.2)

## 2020-11-23 NOTE — ED Triage Notes (Addendum)
Pt fell at home while moving furniture. Laceration to RT ear.  Bp was elevated per ems.  Cbg is 136. Pt does not remember falling.  Family member with pt reports pt is repeating herself since the incident.

## 2020-11-24 DIAGNOSIS — S01311A Laceration without foreign body of right ear, initial encounter: Secondary | ICD-10-CM | POA: Diagnosis not present

## 2020-11-24 MED ORDER — BACITRACIN ZINC 500 UNIT/GM EX OINT
TOPICAL_OINTMENT | Freq: Two times a day (BID) | CUTANEOUS | Status: DC
  Filled 2020-11-24: qty 1.8

## 2020-11-24 MED ORDER — LIDOCAINE HCL (PF) 2 % IJ SOLN
INTRAMUSCULAR | Status: AC
  Administered 2020-11-24: 10 mL
  Filled 2020-11-24: qty 20

## 2020-11-24 MED ORDER — ACETAMINOPHEN 325 MG PO TABS
650.0000 mg | ORAL_TABLET | Freq: Once | ORAL | Status: AC
  Administered 2020-11-24: 650 mg via ORAL
  Filled 2020-11-24: qty 2

## 2020-11-24 MED ORDER — FENTANYL CITRATE PF 50 MCG/ML IJ SOSY
50.0000 ug | PREFILLED_SYRINGE | Freq: Once | INTRAMUSCULAR | Status: AC
  Administered 2020-11-24: 50 ug via INTRAVENOUS
  Filled 2020-11-24: qty 1

## 2020-11-24 MED ORDER — LIDOCAINE-EPINEPHRINE-TETRACAINE (LET) TOPICAL GEL
3.0000 mL | Freq: Once | TOPICAL | Status: AC
  Administered 2020-11-24: 3 mL via TOPICAL
  Filled 2020-11-24: qty 3

## 2020-11-24 MED ORDER — LIDOCAINE HCL (PF) 2 % IJ SOLN: 10.0000 mL | Freq: Once | INTRAMUSCULAR | Status: AC

## 2020-11-24 NOTE — ED Provider Notes (Signed)
[facility] EMERGENCY DEPARTMENT Provider Note   CSN: [id number] Arrival date & time: 11/23/20  2054     History Chief Complaint  Patient presents with   Marletta Lor    Kathleen Keller is a 80 y.o. female.  79 year old female who presents emerged from today secondary to a fall.  States that she does not remember the fall.  She states that she was moving furniture and then she woke up having fallen on the ground blood over the place.  She suffered cuts to her ear and her head.  She called her family who called EMS and brought here for further evaluation.  Patient has no pain anywhere besides her head.  She is unsure on exactly what happened and she passed out or not.  No other associated symptoms   Fall      Past Medical History:  Diagnosis Date   Arthritis    Hypertension    Thyroid disease     There are no problems to display for this patient.   Past Surgical History:  Procedure Laterality Date   REDUCTION MAMMAPLASTY Bilateral      OB History   No obstetric history on file.     No family history on file.     Home Medications Prior to Admission medications   Medication Sig Start Date End Date Taking? Authorizing Provider  aspirin 81 MG EC tablet Take by mouth. 11/21/98   [provider]  Cholecalciferol 125 MCG (5000 UT) capsule Take by mouth.    [provider]  citalopram (CELEXA) 20 MG tablet Take 20 mg by mouth daily. 06/18/20   [provider]  cyanocobalamin (,VITAMIN B-12,) 1000 MCG/ML injection Inject into the muscle. 08/27/20   [provider]  famotidine (PEPCID) 40 MG tablet Take 40 mg by mouth 2 (two) times daily. 08/31/20   [provider]  levothyroxine (SYNTHROID) 88 MCG tablet Take by mouth. 10/30/1990   [provider]  lisinopril (ZESTRIL) 5 MG tablet Take by mouth.    [provider]  omeprazole (PRILOSEC) 40 MG capsule Take 40 mg by mouth 2 (two) times daily. 08/21/20   [provider]  ondansetron (ZOFRAN) 4 MG tablet Take 1 tablet (4 mg total) by mouth every 8 (eight) hours as needed for nausea or vomiting. 10/08/20   Regal, Kirstie Peri, DPM  oxyCODONE-acetaminophen (PERCOCET) 10-325 MG tablet Take 1 tablet by mouth every 4 (four) hours as needed for pain. 10/08/20   Lenn Sink, DPM  venlafaxine XR (EFFEXOR-XR) 37.5 MG 24 hr capsule Take 37.5 mg by mouth daily. 09/11/20   [provider]    Allergies    Codeine, Darvon [propoxyphene], Demerol [meperidine hcl], and Penicillins  Review of Systems   Review of Systems  All other systems reviewed and are negative.  Physical Exam Updated Vital Signs BP (!) 175/79 (BP Location: Left Arm)   Pulse 72   Temp 97.8 F (36.6 C) (Oral)   Resp 17   Ht 5\' 1"  (1.549 m)   Wt 68 kg   SpO2 98%   BMI 28.34 kg/m   Physical Exam Vitals and nursing note reviewed.  Constitutional:      Appearance: She is well-developed.  HENT:     Head: Normocephalic and atraumatic.     Mouth/Throat:     Mouth: Mucous membranes are moist.     Pharynx: Oropharynx is clear.  Cardiovascular:     Rate and Rhythm: Normal rate and regular rhythm.  Pulmonary:  Effort: Pulmonary effort is normal. No respiratory distress.     Breath sounds: No stridor.  Abdominal:     General: Abdomen is flat. There is no distension.  Musculoskeletal:        General: No swelling or tenderness. Normal range of motion.     Cervical back: Normal range of motion.  Skin:    General: Skin is warm and dry.  Neurological:     General: No focal deficit present.     Mental Status: She is alert.    ED Results / Procedures / Treatments   Labs (all labs ordered are listed, but only abnormal results are displayed) Labs Reviewed  BASIC METABOLIC PANEL - Abnormal; Notable for the following components:      Result Value   Glucose, Bld 112 (*)    BUN 26 (*)    All other components within normal limits  CBC  URINALYSIS, ROUTINE W REFLEX MICROSCOPIC     EKG None  Radiology CT HEAD WO CONTRAST  Result Date: 11/23/2020 CLINICAL DATA:  Syncope, simple, abnormal neuro exam. Altered mental status, head injury. EXAM: CT HEAD WITHOUT CONTRAST TECHNIQUE: Contiguous axial images were obtained from the base of the skull through the vertex without intravenous contrast. COMPARISON:  None. FINDINGS: Brain: Normal anatomic configuration. No abnormal intra or extra-axial mass lesion or fluid collection. No abnormal mass effect or midline shift. No evidence of acute intracranial hemorrhage or infarct. Ventricular size is normal. Cerebellum unremarkable. Vascular: Unremarkable Skull: Intact Sinuses/Orbits: Mucous retention cyst partially visualized within the left maxillary sinus. Remaining paranasal sinuses are clear. Orbits are unremarkable. Other: Mastoid air cells and middle ear cavities are clear. Right frontotemporal scalp soft tissue swelling and subcutaneous gas noted in keeping with history of head injury and scalp laceration. IMPRESSION: No acute intracranial abnormality. No calvarial fracture. Right scalp soft tissue swelling and subcutaneous gas. Minimal left paranasal sinus disease. Electronically Signed   By: Helyn Numbers M.D.   On: 11/23/2020 21:58    Procedures .Marland KitchenLaceration Repair  Date/Time: 11/24/2020 4:40 AM Performed by: Marily Memos, MD Authorized by: Marily Memos, MD   Consent:    Consent obtained:  Verbal   Consent given by:  Patient   Risks discussed:  Infection, need for additional repair, nerve damage, poor wound healing, poor cosmetic result, pain, retained foreign body, tendon damage and vascular damage   Alternatives discussed:  No treatment, delayed treatment and observation Universal protocol:    Procedure explained and questions answered to patient or proxy's satisfaction: yes     Relevant documents present and verified: yes     Site/side marked: yes     Patient identity confirmed:  Hospital-assigned identification  number and arm band Anesthesia:    Anesthesia method:  Local infiltration and topical application Laceration details:    Location:  Ear   Ear location:  R ear   Length (cm):  3   Laceration depth: Full-thickness. Pre-procedure details:    Preparation:  Patient was prepped and draped in usual sterile fashion and imaging obtained to evaluate for foreign bodies Exploration:    Wound exploration: wound explored through full range of motion   Treatment:    Area cleansed with:  Saline   Amount of cleaning:  Extensive   Irrigation solution:  Sterile water   Irrigation method:  Syringe   Debridement:  None   Undermining:  None   Scar revision: no   Skin repair:    Repair method:  Sutures   Suture size:  5-0   Suture material:  Fast-absorbing gut   Suture technique:  Simple interrupted   Number of sutures:  9 Approximation:    Approximation:  Close Repair type:    Repair type:  Complex Post-procedure details:    Dressing:  Antibiotic ointment and tube gauze   Procedure completion:  Tolerated well, no immediate complications   Medications Ordered in ED Medications  bacitracin ointment ( Topical Given 11/24/20 0442)  acetaminophen (TYLENOL) tablet 650 mg (650 mg Oral Given 11/24/20 0107)  lidocaine-EPINEPHrine-tetracaine (LET) topical gel (3 mLs Topical Given 11/24/20 0201)  fentaNYL (SUBLIMAZE) injection 50 mcg (50 mcg Intravenous Given 11/24/20 0200)  lidocaine HCl (PF) (XYLOCAINE) 2 % injection 10 mL (10 mLs Other Given 11/24/20 0201)    ED Course  I have reviewed the triage vital signs and the nursing notes.  Pertinent labs & imaging results that were available during my care of the patient were reviewed by me and considered in my medical decision making (see chart for details).    MDM Rules/Calculators/A&P                         Fall with either anterograde amnesia or syncope.  Evaluated for same.  Patient wound repaired as above.  Did have a cartilage injury.  Will  refer to ENT for follow-up.  Use absorbable sutures so will not need follow-up for that but will need follow-up for consideration of revision if necessary.   Final Clinical Impression(s) / ED Diagnoses Final diagnoses:  Fall, initial encounter  Injury of head, initial encounter    Rx / DC Orders ED Discharge Orders     None        Margueritte Guthridge, Barbara Cower, MD 11/24/20 [phone]

## 2020-11-24 NOTE — ED Notes (Signed)
Pt ambulatory in hallway with no difficulty or distress noted.

## 2020-12-07 DIAGNOSIS — I10 Essential (primary) hypertension: Secondary | ICD-10-CM | POA: Diagnosis not present

## 2020-12-07 DIAGNOSIS — E538 Deficiency of other specified B group vitamins: Secondary | ICD-10-CM | POA: Diagnosis not present

## 2020-12-07 DIAGNOSIS — R55 Syncope and collapse: Secondary | ICD-10-CM | POA: Diagnosis not present

## 2020-12-07 DIAGNOSIS — Z23 Encounter for immunization: Secondary | ICD-10-CM | POA: Diagnosis not present

## 2020-12-07 DIAGNOSIS — D72818 Other decreased white blood cell count: Secondary | ICD-10-CM | POA: Diagnosis not present

## 2020-12-07 DIAGNOSIS — D508 Other iron deficiency anemias: Secondary | ICD-10-CM | POA: Diagnosis not present

## 2021-01-03 DIAGNOSIS — N3000 Acute cystitis without hematuria: Secondary | ICD-10-CM | POA: Diagnosis not present

## 2021-02-06 DIAGNOSIS — B3731 Acute candidiasis of vulva and vagina: Secondary | ICD-10-CM | POA: Diagnosis not present

## 2021-02-06 DIAGNOSIS — D508 Other iron deficiency anemias: Secondary | ICD-10-CM | POA: Diagnosis not present

## 2021-02-06 DIAGNOSIS — E039 Hypothyroidism, unspecified: Secondary | ICD-10-CM | POA: Diagnosis not present

## 2021-02-06 DIAGNOSIS — E538 Deficiency of other specified B group vitamins: Secondary | ICD-10-CM | POA: Diagnosis not present

## 2021-03-20 ENCOUNTER — Other Ambulatory Visit (HOSPITAL_COMMUNITY): Payer: Self-pay | Admitting: Adult Health Nurse Practitioner

## 2021-03-20 DIAGNOSIS — Z1231 Encounter for screening mammogram for malignant neoplasm of breast: Secondary | ICD-10-CM

## 2021-04-25 ENCOUNTER — Other Ambulatory Visit (HOSPITAL_COMMUNITY): Payer: Self-pay | Admitting: Adult Health Nurse Practitioner

## 2021-04-25 ENCOUNTER — Other Ambulatory Visit: Payer: Self-pay | Admitting: Adult Health Nurse Practitioner

## 2021-04-25 DIAGNOSIS — E0590 Thyrotoxicosis, unspecified without thyrotoxic crisis or storm: Secondary | ICD-10-CM

## 2021-04-29 ENCOUNTER — Ambulatory Visit (HOSPITAL_COMMUNITY)
Admission: RE | Admit: 2021-04-29 | Discharge: 2021-04-29 | Disposition: A | Discharge status: Home / Self Care | Payer: Medicare Other | Source: Ambulatory Visit | Attending: Adult Health Nurse Practitioner | Admitting: Adult Health Nurse Practitioner

## 2021-04-29 DIAGNOSIS — Z1231 Encounter for screening mammogram for malignant neoplasm of breast: Secondary | ICD-10-CM | POA: Insufficient documentation

## 2021-04-29 DIAGNOSIS — E0590 Thyrotoxicosis, unspecified without thyrotoxic crisis or storm: Secondary | ICD-10-CM

## 2021-05-22 ENCOUNTER — Ambulatory Visit (INDEPENDENT_AMBULATORY_CARE_PROVIDER_SITE_OTHER): Payer: Medicare Other

## 2021-05-22 ENCOUNTER — Encounter: Payer: Self-pay | Admitting: Podiatry

## 2021-05-22 ENCOUNTER — Ambulatory Visit (INDEPENDENT_AMBULATORY_CARE_PROVIDER_SITE_OTHER): Payer: Medicare Other | Admitting: Podiatry

## 2021-05-22 DIAGNOSIS — M779 Enthesopathy, unspecified: Secondary | ICD-10-CM | POA: Diagnosis not present

## 2021-05-22 DIAGNOSIS — Z9889 Other specified postprocedural states: Secondary | ICD-10-CM | POA: Diagnosis not present

## 2021-05-22 DIAGNOSIS — L600 Ingrowing nail: Secondary | ICD-10-CM | POA: Diagnosis not present

## 2021-05-22 DIAGNOSIS — M21619 Bunion of unspecified foot: Secondary | ICD-10-CM | POA: Diagnosis not present

## 2021-05-22 NOTE — Progress Notes (Signed)
Subjective:  ? ?Patient ID: Kathleen Keller, female   DOB: 81 y.o.   MRN: [id number]  ? ?HPI ?Patient presents with numerous concerns with 1 being pain in the midfoot right with a knot that appears at different times and incurvated nail beds second of both feet right big toe and mild discomfort in the plantar feet especially when standing.  Very happy with previous bunion surgery ? ? ?ROS ? ? ?   ?Objective:  ?Physical Exam  ?Neurovascular status intact with patient found to have incurvated second nails bilateral both medial lateral borders and medial border right hallux with some crusted tissue on the left with well-healed surgical site right first metatarsal mild prominence of the pin but a discomfort that is more proximal but difficult to evaluate.  Good digital perfusion ? ?   ?Assessment:  ?Numerous different problems difficult to make complete determination as to specifics with pins in good alignment excellent correction of bunion chronic ingrown toenail deformities ? ?   ?Plan:  ?H&P reviewed all conditions separately and x-ray and at this point debridement to take pressure off the corners and do not recommend more aggressive treatment even though nail removal may be necessary at 1 point future.  Patient will be seen back to recheck encouraged to call with questions ? ?X-ray indicates the osteotomy right has healed beautifully good alignment noted excellent correction of underlying deformity ?   ? ? ?

## 2021-08-01 ENCOUNTER — Ambulatory Visit (HOSPITAL_COMMUNITY): Payer: Medicare Other | Attending: Orthopedic Surgery | Admitting: Physical Therapy

## 2021-08-01 DIAGNOSIS — M546 Pain in thoracic spine: Secondary | ICD-10-CM | POA: Insufficient documentation

## 2021-08-01 DIAGNOSIS — M810 Age-related osteoporosis without current pathological fracture: Secondary | ICD-10-CM | POA: Diagnosis not present

## 2021-08-01 DIAGNOSIS — Z981 Arthrodesis status: Secondary | ICD-10-CM | POA: Insufficient documentation

## 2021-08-01 DIAGNOSIS — M5451 Vertebrogenic low back pain: Secondary | ICD-10-CM | POA: Insufficient documentation

## 2021-08-01 DIAGNOSIS — M5459 Other low back pain: Secondary | ICD-10-CM

## 2021-08-01 DIAGNOSIS — I10 Essential (primary) hypertension: Secondary | ICD-10-CM | POA: Insufficient documentation

## 2021-08-01 DIAGNOSIS — G8929 Other chronic pain: Secondary | ICD-10-CM | POA: Diagnosis not present

## 2021-08-01 DIAGNOSIS — M6281 Muscle weakness (generalized): Secondary | ICD-10-CM

## 2021-08-01 NOTE — Therapy (Signed)
OUTPATIENT PHYSICAL THERAPY THORACOLUMBAR EVALUATION   Patient Name: Kathleen Keller MRN: [id number] DOB:February 09, 1940, 81 y.o., female Today's Date: 08/01/2021   PT End of Session - 08/01/21 0813     Visit Number 1    Number of Visits 12    Date for PT Re-Evaluation 09/12/21    Authorization Type [employer] Medicare.    PT Start Time 0815    PT Stop Time 0857    PT Time Calculation (min) 42 min    Activity Tolerance Patient tolerated treatment well    Behavior During Therapy WFL for tasks assessed/performed             Past Medical History:  Diagnosis Date   Arthritis    Hypertension    Thyroid disease    Past Surgical History:  Procedure Laterality Date   REDUCTION MAMMAPLASTY Bilateral    There are no problems to display for this patient.   PCP: Rosezella [place]   REFERRING PROVIDER: Dr. Venita Lick  REFERRING DIAG: PT eval and tx M54.51 Vertebrogenic low back pain  Rationale for Evaluation and Treatment Rehabilitation  THERAPY DIAG:  Other low back pain  ONSET DATE: acute exacerbation in May  SUBJECTIVE:                                                                                                                                                                                           SUBJECTIVE STATEMENT: Ms. Walsh states that she has equal pain in her thoracic area and Lt lumbar pain. Her pain tends to be the greatest in the evening.  She is still working at [employer] and stands prolong periods which seems to hurt it.   PERTINENT HISTORY:  Chronic back pain, cervical surgery with 5 fusions, HTN  PAIN:  Are you having pain? Yes: NPRS scale: 0/10, when it hurts it's a 100  Pain location: thoracic and lumbar area Pain description: stabbing  Aggravating factors: standing, lifting ,housework  Relieving factors: heating pad    PRECAUTIONS: None  WEIGHT BEARING RESTRICTIONS No  FALLS:  Has patient fallen in last 6 months? No  LIVING ENVIRONMENT: Lives with:   lives alone Lives in: House/apartment Stairs: Yes: External: 5 steps; on right going up Has following equipment at home: None  OCCUPATION: sales; part time at [employer]   PLOF: Independent  PATIENT GOALS To quit hurting.    OBJECTIVE:   DIAGNOSTIC FINDINGS:  OA, osteoporosis   PATIENT SURVEYS:  FOTO 50  SCREENING FOR RED FLAGS: Bowel or bladder incontinence: No Spinal tumors: No Cauda equina syndrome: No Compression fracture: No Abdominal aneurysm: No  COGNITION:  Overall cognitive status: Within  functional limits for tasks assessed     SENSATION: Not tested  MUSCLE LENGTH: Hamstrings: Right 170 deg; Left 170 deg  POSTURE: rounded shoulders, forward head, decreased lumbar lordosis, and increased thoracic kyphosis  PALPATION: Tight paraspinal mm   LUMBAR ROM:   Active  A/PROM  eval  Flexion Fingers to floor   Extension 18; reps no change   Right lateral flexion   Left lateral flexion   Right rotation   Left rotation    (Blank rows = not tested)  Rt knee flexion limited to 80 degrees:   PT is suppose to have TKR  LOWER EXTREMITY MMT:    MMT Right eval Left eval  Hip flexion 5 5  Hip extension 3- 3-  Hip abduction 4 4  Hip adduction    Hip internal rotation    Hip external rotation    Knee flexion    Knee extension 5 5  Ankle dorsiflexion 4+ pt has had surgery on this foot  5/5  Ankle plantarflexion    Ankle inversion    Ankle eversion     (Blank rows = not tested)    FUNCTIONAL TESTS:  2 minute walk test: 426 ft no increased pain.  Single leg stance:  Lt: 14""; RT: 2      TODAY'S TREATMENT  08/01/21:  evaluation; Decompression 1-5    PATIENT EDUCATION:  Education details: HEP, the importance of posture.  Person educated: Patient Education method: Explanation, Demonstration, and Handouts Education comprehension: verbalized understanding and returned demonstration   HOME EXERCISE PROGRAM: 08/01/21: decompression exercise    ASSESSMENT:  CLINICAL IMPRESSION: Patient is a 81 y.o. female who was seen today for physical therapy evaluation and treatment for Thoracic and lumbar pain.  Evaluation demonstrates pt has a hx of osteoporosis as well as OA.  The pt has increased pain, decreased strength, decreased activity tolerance and mm spasms.  Ms. Rutten will benefit from skilled PT to address these issues to decrease her pain and improve her quality of life.    OBJECTIVE IMPAIRMENTS decreased activity tolerance, decreased balance, decreased strength, increased fascial restrictions, increased muscle spasms, and pain.   ACTIVITY LIMITATIONS carrying, lifting, bending, sitting, and standing  PARTICIPATION LIMITATIONS: meal prep, cleaning, laundry, and occupation  PERSONAL FACTORS Age, Fitness, Time since onset of injury/illness/exacerbation, and 1 comorbidity: Osteoporosis   are also affecting patient's functional outcome.   REHAB POTENTIAL: Good  CLINICAL DECISION MAKING: Stable/uncomplicated  EVALUATION COMPLEXITY: Moderate   GOALS: Goals reviewed with patient? No  SHORT TERM GOALS: Target date: 08/22/2021  Pt to be completing a HEP to decrease back pain to no more than a 6/10 Baseline: Goal status: INITIAL  2.  PT to be able to verbalize the importance of posture and state that she is actively working on improving her posture.  Baseline:  Goal status: INITIAL     LONG TERM GOALS: Target date: 09/12/2021  Pt to be completing an advanced HEP to decrease her back pain to no greater than a 4/10 Baseline:  Goal status: INITIAL  2.  Pt to be able to verbalize  the importance of, and be actively working on improving body mechanics.  Baseline:  Goal status: INITIAL  3.  PT to be able to single leg stance for 20" B to reduce risk of falling  Baseline:  Goal status: INITIAL  4.  PT leg mm strength to be at least 4+/5 to decrease stress off her back.  Baseline:  Goal status: INITIAL   PLAN:  PT  FREQUENCY: 2x/week  PT DURATION: 6 weeks  PLANNED INTERVENTIONS: Therapeutic exercises, Therapeutic activity, Balance training, Patient/Family education, and Manual therapy.  PLAN FOR NEXT SESSION: review decompression 1-5, begin thera-band decompression exercises as well as stabilization.  Avoid flexion due to osteoporosis and explain to pt that she should avoid flexion.     Virgina Organ, PT CLT [phone]  10:19 AM

## 2021-08-05 ENCOUNTER — Encounter (HOSPITAL_COMMUNITY): Payer: Medicare Other | Admitting: Physical Therapy

## 2021-08-08 ENCOUNTER — Ambulatory Visit (HOSPITAL_COMMUNITY): Payer: Medicare Other | Attending: Gastroenterology | Admitting: Physical Therapy

## 2021-08-08 DIAGNOSIS — M5459 Other low back pain: Secondary | ICD-10-CM | POA: Diagnosis not present

## 2021-08-08 DIAGNOSIS — M6281 Muscle weakness (generalized): Secondary | ICD-10-CM | POA: Diagnosis present

## 2021-08-08 NOTE — Therapy (Signed)
OUTPATIENT PHYSICAL THERAPY THORACOLUMBAR EVALUATION   Patient Name: Kathleen Keller MRN: [id number] DOB:February 24, 1940, 81 y.o., female Today's Date: 08/08/2021   PT End of Session - 08/08/21 1435     Visit Number 2    Number of Visits 12    Date for PT Re-Evaluation 09/12/21    Authorization Type [employer] Medicare.    PT Start Time 1430    PT Stop Time 1514    PT Time Calculation (min) 44 min    Activity Tolerance Patient tolerated treatment well    Behavior During Therapy WFL for tasks assessed/performed             Past Medical History:  Diagnosis Date   Arthritis    Hypertension    Thyroid disease    Past Surgical History:  Procedure Laterality Date   REDUCTION MAMMAPLASTY Bilateral    There are no problems to display for this patient.   PCP: Skeet Latch   REFERRING PROVIDER: Dr. Melina Schools  REFERRING DIAG: PT eval and tx M54.51 Vertebrogenic low back pain  Rationale for Evaluation and Treatment Rehabilitation  THERAPY DIAG:  Other low back pain  Muscle weakness (generalized)  ONSET DATE: acute exacerbation in May  SUBJECTIVE:                                                                                                                                                                                           SUBJECTIVE STATEMENT: Pt states she's "been doing".  Trying to get everything done around her house.  Currently some discomfort and tends to move from upper to lower back.  Reports compliance with HEP.  State she's been off the last 2 days from [place].    PERTINENT HISTORY:  Chronic back pain, cervical surgery with 5 fusions, HTN  PAIN:  Are you having pain? Yes: NPRS scale: 0/10, when it hurts it's a 100  Pain location: thoracic and lumbar area Pain description: stabbing  Aggravating factors: standing, lifting ,housework  Relieving factors: heating pad    PRECAUTIONS: None  WEIGHT BEARING RESTRICTIONS No  FALLS:  Has patient fallen in  last 6 months? No  LIVING ENVIRONMENT: Lives with:  lives alone Lives in: House/apartment Stairs: Yes: External: 5 steps; on right going up Has following equipment at home: None  OCCUPATION: sales; part time at [employer]   PLOF: Independent  PATIENT GOALS To quit hurting.    OBJECTIVE:   DIAGNOSTIC FINDINGS:  OA, osteoporosis   PATIENT SURVEYS:  FOTO 50  SCREENING FOR RED FLAGS: Bowel or bladder incontinence: No Spinal tumors: No Cauda equina syndrome: No Compression fracture: No Abdominal aneurysm: No  COGNITION:  Overall cognitive status: Within functional limits for tasks assessed     SENSATION: Not tested  MUSCLE LENGTH: Hamstrings: Right 170 deg; Left 170 deg  POSTURE: rounded shoulders, forward head, decreased lumbar lordosis, and increased thoracic kyphosis  PALPATION: Tight paraspinal mm   LUMBAR ROM:   Active  A/PROM  eval  Flexion Fingers to floor   Extension 18; reps no change   Right lateral flexion   Left lateral flexion   Right rotation   Left rotation    (Blank rows = not tested)  Rt knee flexion limited to 80 degrees:   PT is suppose to have TKR  LOWER EXTREMITY MMT:    MMT Right eval Left eval  Hip flexion 5 5  Hip extension 3- 3-  Hip abduction 4 4  Hip adduction    Hip internal rotation    Hip external rotation    Knee flexion    Knee extension 5 5  Ankle dorsiflexion 4+ pt has had surgery on this foot  5/5  Ankle plantarflexion    Ankle inversion    Ankle eversion     (Blank rows = not tested)    FUNCTIONAL TESTS:  2 minute walk test: 426 ft no increased pain.  Single leg stance:  Lt: 14""; RT: 2      TODAY'S TREATMENT  08/08/21 Supine:  decompression 2-5 5X5" holds  Decompression red theraband exericises 1-4 5X  Bridge 10X  SLR with core stab 10X each side  08/01/21:  evaluation; Decompression 1-5    PATIENT EDUCATION:  Education details: HEP, the importance of posture.  Person educated: Patient Education  method: Explanation, Demonstration, and Handouts Education comprehension: verbalized understanding and returned demonstration   HOME EXERCISE PROGRAM: Access Code: [email or web address] URL: [email or web address] Date: 08/08/2021 Prepared by: Emeline Gins Exercises - Supine Bridge  - 2 x daily - 7 x weekly - 2 sets - 10 reps - Small Range Straight Leg Raise  - 2 x daily - 7 x weekly - 2 sets - 10 reps Decompression RTB exercises  08/01/21: decompression exercise   ASSESSMENT:  CLINICAL IMPRESSION: Goals and POC reviewed.  Pt completed decompression exercises given at HEP with minimal cues and began theraband decompression exercises.  Educated on alternate ways of item retrieval from floor rather than bending forward. Pt also has painful knees which limits her squatting and kneeling ability. Began glute and core strengthening exercises as well.  Pt able to complete all in good form without c/o pain.  [name] will continue to benefit from skilled PT to address deficits, decrease her pain and improve her quality of life.    OBJECTIVE IMPAIRMENTS decreased activity tolerance, decreased balance, decreased strength, increased fascial restrictions, increased muscle spasms, and pain.   ACTIVITY LIMITATIONS carrying, lifting, bending, sitting, and standing  PARTICIPATION LIMITATIONS: meal prep, cleaning, laundry, and occupation  PERSONAL FACTORS Age, Fitness, Time since onset of injury/illness/exacerbation, and 1 comorbidity: Osteoporosis   are also affecting patient's functional outcome.   REHAB POTENTIAL: Good  CLINICAL DECISION MAKING: Stable/uncomplicated  EVALUATION COMPLEXITY: Moderate   GOALS: Goals reviewed with patient? No  SHORT TERM GOALS: Target date: 08/29/2021  Pt to be completing a HEP to decrease back pain to no more than a 6/10 Baseline: Goal status: IN PROGRESS  2.  PT to be able to verbalize the importance of posture and state that she is actively working on  improving her posture.  Baseline:  Goal status: IN PROGRESS     LONG  TERM GOALS: Target date: 09/19/2021  Pt to be completing an advanced HEP to decrease her back pain to no greater than a 4/10 Baseline:  Goal status: IN PROGRESS  2.  Pt to be able to verbalize  the importance of, and be actively working on improving body mechanics.  Baseline:  Goal status: IN PROGRESS  3.  PT to be able to single leg stance for 20" B to reduce risk of falling  Baseline:  Goal status: IN PROGRESS  4.  PT leg mm strength to be at least 4+/5 to decrease stress off her back.  Baseline:  Goal status: IN PROGRESS   PLAN: PT FREQUENCY: 2x/week  PT DURATION: 6 weeks  PLANNED INTERVENTIONS: Therapeutic exercises, Therapeutic activity, Balance training, Patient/Family education, and Manual therapy.  PLAN FOR NEXT SESSION:  continue with core stabilization and decompression.  Avoid flexion due to osteoporosis.      Lurena Nida, PTA 08/08/2021, 2:58 PM Sheneika Walstad Kae Heller, PTA/CLT [facility] [place] Ph: [phone]

## 2021-08-12 ENCOUNTER — Ambulatory Visit (HOSPITAL_COMMUNITY): Payer: Medicare Other | Admitting: Physical Therapy

## 2021-08-12 DIAGNOSIS — M5459 Other low back pain: Secondary | ICD-10-CM | POA: Diagnosis not present

## 2021-08-12 DIAGNOSIS — M6281 Muscle weakness (generalized): Secondary | ICD-10-CM

## 2021-08-12 NOTE — Therapy (Signed)
OUTPATIENT PHYSICAL THERAPY THORACOLUMBAR EVALUATION   Patient Name: Kathleen Keller MRN: [id number] DOB:12/28/1940, 81 y.o., female Today's Date: 08/12/2021   PT End of Session - 08/12/21 1513    Visit Number 3    Number of Visits 12    Date for PT Re-Evaluation 09/12/21    Authorization Type [employer] Medicare.    PT Start Time 1430    PT Stop Time 1510    PT Time Calculation (min) 40 min    Activity Tolerance Patient tolerated treatment well    Behavior During Therapy WFL for tasks assessed/performed              Past Medical History:  Diagnosis Date   Arthritis    Hypertension    Thyroid disease    Past Surgical History:  Procedure Laterality Date   REDUCTION MAMMAPLASTY Bilateral    There are no problems to display for this patient.   PCP: Rosezella [place]   REFERRING PROVIDER: Dr. Venita Lick  REFERRING DIAG: PT eval and tx M54.51 Vertebrogenic low back pain  Rationale for Evaluation and Treatment Rehabilitation  THERAPY DIAG:  Other low back pain  Muscle weakness (generalized)  ONSET DATE: acute exacerbation in May  SUBJECTIVE:                                                                                                                                                                                           SUBJECTIVE STATEMENT:  Pt states that this is her fifth straight day of working and her back is killing her.  She has been doing her exercises and has no questions.  PERTINENT HISTORY:  Chronic back pain, cervical surgery with 5 fusions, HTN  PAIN:  Are you having pain? Yes: NPRS scale: 6/10,  was a 9 prior to going on the heating pad.  Right side bothers her more than the left.  Pain location: thoracic and lumbar area Pain description: stabbing  Aggravating factors: standing, lifting ,housework  Relieving factors: heating pad    PATIENT GOALS To quit hurting.    OBJECTIVE:   DIAGNOSTIC FINDINGS:  OA, osteoporosis   PATIENT SURVEYS:   FOTO 50 MUSCLE LENGTH: Hamstrings: Right 170 deg; Left 170 deg  POSTURE: rounded shoulders, forward head, decreased lumbar lordosis, and increased thoracic kyphosis  PALPATION: Tight paraspinal mm   LUMBAR ROM:   Active  A/PROM  eval  Flexion Fingers to floor   Extension 18; reps no change   Right lateral flexion   Left lateral flexion   Right rotation   Left rotation    (Blank rows = not tested)  Rt knee flexion limited to  80 degrees:   PT is suppose to have TKR  LOWER EXTREMITY MMT:    MMT Right eval Left eval  Hip flexion 5 5  Hip extension 3- 3-  Hip abduction 4 4  Hip adduction    Hip internal rotation    Hip external rotation    Knee flexion    Knee extension 5 5  Ankle dorsiflexion 4+ pt has had surgery on this foot  5/5  Ankle plantarflexion    Ankle inversion    Ankle eversion     (Blank rows = not tested)    FUNCTIONAL TESTS:  2 minute walk test: 426 ft no increased pain.  Single leg stance:  Lt: 14""; RT: 2      TODAY'S TREATMENT              08/12/21             Standing:             Wall arch x 10             Y lift off x 5             Sitting:             W back x 10             Backward shoulder rolls x 10             Ab set x 10              Supine:              Ab set x 10              Hamstring stretch 3 x 30"                Bridge 10" hold x 10              Marching x 10  Manual completed separate from all other aspects of treatment.  Manual consisted of efflurage and pettrisage to decrease pain.  08/08/21 Supine:  decompression 2-5 5X5" holds  Decompression red theraband exericises 1-4 5X  Bridge 10X  SLR with core stab 10X each side  08/01/21:  evaluation; Decompression 1-5    PATIENT EDUCATION:  Education details: HEP, the importance of posture.  Person educated: Patient Education method: Explanation, Demonstration, and Handouts Education comprehension: verbalized understanding and returned demonstration   HOME  EXERCISE PROGRAM: Access Code: [email or web address] URL: [email or web address] Date: 08/08/2021 Prepared by: Emeline Gins Exercises - Supine Bridge  - 2 x daily - 7 x weekly - 2 sets - 10 reps - Small Range Straight Leg Raise  - 2 x daily - 7 x weekly - 2 sets - 10 reps Decompression RTB exercises  08/01/21: decompression exercise   ASSESSMENT:  CLINICAL IMPRESSION:  Session focused on improving posture.  Added higher level stability exercises to program but not to HEP at this point.  Manual completed to decrease mm tightness and pain.  Ms. Branaman will continue to benefit from skilled PT to address deficits, decrease her pain and improve her quality of life.    OBJECTIVE IMPAIRMENTS decreased activity tolerance, decreased balance, decreased strength, increased fascial restrictions, increased muscle spasms, and pain.   ACTIVITY LIMITATIONS carrying, lifting, bending, sitting, and standing  PARTICIPATION LIMITATIONS: meal prep, cleaning, laundry, and occupation  PERSONAL FACTORS Age, Fitness, Time since onset of injury/illness/exacerbation, and 1 comorbidity: Osteoporosis   are also affecting patient's  functional outcome.   REHAB POTENTIAL: Good  CLINICAL DECISION MAKING: Stable/uncomplicated  EVALUATION COMPLEXITY: Moderate   GOALS: Goals reviewed with patient? No  SHORT TERM GOALS: Target date: 09/02/2021  Pt to be completing a HEP to decrease back pain to no more than a 6/10 Baseline: Goal status: IN PROGRESS  2.  PT to be able to verbalize the importance of posture and state that she is actively working on improving her posture.  Baseline:  Goal status: IN PROGRESS     LONG TERM GOALS: Target date: 09/23/2021  Pt to be completing an advanced HEP to decrease her back pain to no greater than a 4/10 Baseline:  Goal status: IN PROGRESS  2.  Pt to be able to verbalize  the importance of, and be actively working on improving body mechanics.  Baseline:  Goal status: IN  PROGRESS  3.  PT to be able to single leg stance for 20" B to reduce risk of falling  Baseline:  Goal status: IN PROGRESS  4.  PT leg mm strength to be at least 4+/5 to decrease stress off her back.  Baseline:  Goal status: IN PROGRESS   PLAN: PT FREQUENCY: 2x/week  PT DURATION: 6 weeks  PLANNED INTERVENTIONS: Therapeutic exercises, Therapeutic activity, Balance training, Patient/Family education, and Manual therapy.  PLAN FOR NEXT SESSION:  continue with manual, core stabilization and decompression.  Avoid flexion due to osteoporosis.     Virgina Organ, PT CLT [phone]  08/12/2021, 3:14 PM

## 2021-08-15 ENCOUNTER — Ambulatory Visit (HOSPITAL_COMMUNITY): Payer: Medicare Other | Admitting: Physical Therapy

## 2021-08-19 ENCOUNTER — Ambulatory Visit (HOSPITAL_COMMUNITY): Payer: Medicare Other | Admitting: Physical Therapy

## 2021-08-22 ENCOUNTER — Ambulatory Visit (HOSPITAL_COMMUNITY): Payer: Medicare Other | Admitting: Physical Therapy

## 2021-08-27 ENCOUNTER — Encounter (HOSPITAL_COMMUNITY): Payer: Medicare Other | Admitting: Physical Therapy

## 2021-08-29 ENCOUNTER — Encounter (HOSPITAL_COMMUNITY): Payer: Medicare Other | Admitting: Physical Therapy

## 2021-09-03 ENCOUNTER — Encounter (HOSPITAL_COMMUNITY): Payer: Medicare Other | Admitting: Physical Therapy

## 2021-09-05 ENCOUNTER — Encounter (HOSPITAL_COMMUNITY): Payer: Medicare Other | Admitting: Physical Therapy

## 2021-09-10 ENCOUNTER — Encounter (HOSPITAL_COMMUNITY): Payer: Medicare Other | Admitting: Physical Therapy

## 2021-09-12 ENCOUNTER — Encounter (HOSPITAL_COMMUNITY): Payer: Medicare Other | Admitting: Physical Therapy

## 2021-11-11 ENCOUNTER — Other Ambulatory Visit (HOSPITAL_COMMUNITY): Payer: Self-pay | Admitting: Adult Health Nurse Practitioner

## 2021-11-11 DIAGNOSIS — R55 Syncope and collapse: Secondary | ICD-10-CM

## 2021-11-11 DIAGNOSIS — R42 Dizziness and giddiness: Secondary | ICD-10-CM

## 2021-11-20 ENCOUNTER — Ambulatory Visit (HOSPITAL_COMMUNITY)
Admission: RE | Admit: 2021-11-20 | Discharge: 2021-11-20 | Disposition: A | Discharge status: Home / Self Care | Payer: Medicare Other | Source: Ambulatory Visit | Attending: Adult Health Nurse Practitioner | Admitting: Adult Health Nurse Practitioner

## 2021-11-20 DIAGNOSIS — R55 Syncope and collapse: Secondary | ICD-10-CM | POA: Diagnosis present

## 2021-11-20 DIAGNOSIS — R42 Dizziness and giddiness: Secondary | ICD-10-CM | POA: Diagnosis present

## 2021-12-04 ENCOUNTER — Ambulatory Visit (INDEPENDENT_AMBULATORY_CARE_PROVIDER_SITE_OTHER): Payer: Medicare Other | Admitting: Vascular Surgery

## 2021-12-04 ENCOUNTER — Encounter: Payer: Self-pay | Admitting: Vascular Surgery

## 2021-12-04 VITALS — BP 135/73 | HR 69 | Temp 98.2°F | Ht 61.0 in | Wt 141.2 lb

## 2021-12-04 DIAGNOSIS — I6523 Occlusion and stenosis of bilateral carotid arteries: Secondary | ICD-10-CM

## 2021-12-04 NOTE — Progress Notes (Signed)
[facility] [place]  Patient name: Kathleen Keller MRN: [id number] DOB: [birth year] Sex: female  REASON FOR CONSULT: Evaluation bilateral carotid stenosis  HPI: Kathleen Keller is a 81 y.o. female, who is here today for evaluation of duplex findings of bilateral carotid stenosis.  She reports having a very stressful several months.  She reports that her son was killed on September 27.  In October, she had a syncopal episode and fell and had extensive bruising to her right face.  In the work-up of her syncopal issues she underwent carotid duplex which revealed moderate bilateral disease.  She is here for further discussion of this.  She denies any prior history of focal neurologic deficit.  She denies stroke or amaurosis fugax or aphasia.  She does report vertigo since the event that she fell.  She denies cardiac disease.  Past Medical History:  Diagnosis Date   Arthritis    Essential hypertension    GERD with esophagitis    Hypertension    Iron deficiency anemia    Leucopenia    MDD (major depressive disorder)    OAB (overactive bladder)    Thyroid disease    Vertigo    Vitamin B12 deficiency     History reviewed. No pertinent family history.  SOCIAL HISTORY: Social History   Socioeconomic History   Marital status: Divorced    Spouse name: Not on file   Number of children: Not on file   Years of education: Not on file   Highest education level: Not on file  Occupational History   Not on file  Tobacco Use   Smoking status: Never   Smokeless tobacco: Never  Vaping Use   Vaping Use: Never used  Substance and Sexual Activity   Alcohol use: Not Currently   Drug use: Not Currently   Sexual activity: Not on file  Other Topics Concern   Not on file  Social History Narrative   Not on file   Social Determinants of Health   Financial Resource Strain: Not on file  Food Insecurity: Not on file  Transportation Needs: Not  on file  Physical Activity: Not on file  Stress: Not on file  Social Connections: Not on file  Intimate Partner Violence: Not on file    Allergies  Allergen Reactions   Codeine Nausea And Vomiting   Demerol [Meperidine Hcl] Nausea And Vomiting   Penicillins Anaphylaxis   Propoxyphene Nausea And Vomiting    Current Outpatient Medications  Medication Sig Dispense Refill   aspirin 81 MG EC tablet Take by mouth.     Cholecalciferol 125 MCG (5000 UT) capsule Take by mouth.     cyanocobalamin (,VITAMIN B-12,) 1000 MCG/ML injection Inject into the muscle.     levothyroxine (SYNTHROID) 88 MCG tablet Take by mouth.     lisinopril (ZESTRIL) 10 MG tablet Take 10 mg by mouth daily.     omeprazole (PRILOSEC) 40 MG capsule Take 40 mg by mouth 2 (two) times daily.     citalopram (CELEXA) 20 MG tablet Take 20 mg by mouth daily.     famotidine (PEPCID) 40 MG tablet Take 40 mg by mouth 2 (two) times daily.     ondansetron (ZOFRAN) 4 MG tablet Take 1 tablet (4 mg total) by mouth every 8 (eight) hours as needed for nausea or vomiting. 20 tablet 0   oxyCODONE-acetaminophen (PERCOCET) 10-325 MG tablet Take 1 tablet by mouth every 4 (four) hours as needed for pain. 20 tablet 0  venlafaxine XR (EFFEXOR-XR) 37.5 MG 24 hr capsule Take 37.5 mg by mouth daily.     No current facility-administered medications for this visit.    REVIEW OF SYSTEMS:  [X]  denotes positive finding, [ ]  denotes negative finding Cardiac  Comments:  Chest pain or chest pressure:    Shortness of breath upon exertion:    Short of breath when lying flat:    Irregular heart rhythm:        Vascular    Pain in calf, thigh, or hip brought on by ambulation:    Pain in feet at night that wakes you up from your sleep:     Blood clot in your veins:    Leg swelling:         Pulmonary    Oxygen at home:    Productive cough:     Wheezing:         Neurologic    Sudden weakness in arms or legs:     Sudden numbness in arms or legs:      Sudden onset of difficulty speaking or slurred speech:    Temporary loss of vision in one eye:     Problems with dizziness:  x       Gastrointestinal    Blood in stool:     Vomited blood:         Genitourinary    Burning when urinating:     Blood in urine:        Psychiatric    Major depression:         Hematologic    Bleeding problems:    Problems with blood clotting too easily:        Skin    Rashes or ulcers:        Constitutional    Fever or chills:      PHYSICAL EXAM: Vitals:   12/04/21 1323 12/04/21 1326  BP: 137/70 135/73  Pulse: 69   Temp: 98.2 F (36.8 C)   SpO2: 98%   Weight: 141 lb 3.2 oz (64 kg)   Height: 5\' 1"  (1.549 m)     GENERAL: The patient is a well-nourished female, in no acute distress. The vital signs are documented above. CARDIOVASCULAR: Carotid arteries without bruits bilaterally.  2+ radial pulses bilaterally. PULMONARY: There is good air exchange  MUSCULOSKELETAL: There are no major deformities or cyanosis. NEUROLOGIC: No focal weakness or paresthesias are detected. SKIN: There are no ulcers or rashes noted. PSYCHIATRIC: The patient has a normal affect.  DATA:  I reviewed her carotid duplex from 11/20/2021 and discussed this with her.  She is predicted to have 50 to 69% internal carotid artery stenosis bilaterally.  Her flow velocities would certainly indicate that she is in the lower end of this range.  Her vertebral arterial flow is antegrade bilaterally  MEDICAL ISSUES: Discussed that she has moderate carotid stenosis bilaterally and this would not account for her dizziness.  Also explained that she has antegrade vertebral flow so no evidence of vertebrobasilar insufficiency to suggest cause for her vertigo as well.  I did discuss focal symptoms and she knows to report immediately to the emergency room should this occur.  Otherwise I would recommend repeat carotid duplex in a year to rule out progression of disease.  We will see her  again in 1 year with carotid duplex   13/08/23, MD [facility] [phone] Pager [phone]  Note: Portions of  this report may have been transcribed using voice recognition software.  Every effort has been made to ensure accuracy; however, inadvertent computerized transcription errors may still be present.

## 2022-01-15 ENCOUNTER — Encounter (HOSPITAL_COMMUNITY): Payer: Self-pay | Admitting: Physical Therapy

## 2022-01-15 NOTE — Therapy (Signed)
[facility] [address] [place], [place], [address] Phone: [phone]   Fax:  [phone]  Patient Details  Name: Kathleen Keller MRN: [id number] Date of Birth: [birth year] Referring Provider:  No ref. provider found  Encounter Date: 01/15/2022  PHYSICAL THERAPY DISCHARGE SUMMARY  Visits from Start of Care: 3  Current functional level related to goals / functional outcomes: Unknown as pt did not return to clinic   Remaining deficits: Unknown as pt did not return to clinic     Education / Equipment: HEP   Patient agrees to discharge. Patient goals were  unknown . Patient is being discharged due to not returning since the last visit.  Virgina Organ, PT CLT [phone]  01/15/2022, 9:19 AM  [facility] [address] [place], [place], [address] Phone: [phone]   Fax:  [phone]

## 2022-03-20 ENCOUNTER — Other Ambulatory Visit (HOSPITAL_COMMUNITY): Payer: Self-pay | Admitting: Adult Health Nurse Practitioner

## 2022-03-20 DIAGNOSIS — Z1231 Encounter for screening mammogram for malignant neoplasm of breast: Secondary | ICD-10-CM

## 2022-04-17 ENCOUNTER — Ambulatory Visit (HOSPITAL_COMMUNITY): Payer: Medicare HMO

## 2022-05-02 ENCOUNTER — Ambulatory Visit (HOSPITAL_COMMUNITY)
Admission: RE | Admit: 2022-05-02 | Discharge: 2022-05-02 | Disposition: A | Discharge status: Home / Self Care | Payer: Medicare HMO | Source: Ambulatory Visit | Attending: Adult Health Nurse Practitioner | Admitting: Adult Health Nurse Practitioner

## 2022-05-02 DIAGNOSIS — Z1231 Encounter for screening mammogram for malignant neoplasm of breast: Secondary | ICD-10-CM | POA: Diagnosis present

## 2022-11-07 IMAGING — MG MM DIGITAL SCREENING BILAT W/ TOMO AND CAD
6 of 12 series · 6 of 36 positions shown · non-contrast
Comparison: Previous exam(s).

CLINICAL DATA: Screening.

EXAM:
DIGITAL SCREENING BILATERAL MAMMOGRAM WITH TOMOSYNTHESIS AND CAD
TECHNIQUE: Bilateral screening digital craniocaudal and mediolateral oblique
mammograms were obtained. Bilateral screening digital breast
tomosynthesis was performed. The images were evaluated with
computer-aided detection.

[L MLO synth-2D (1 of 2)]
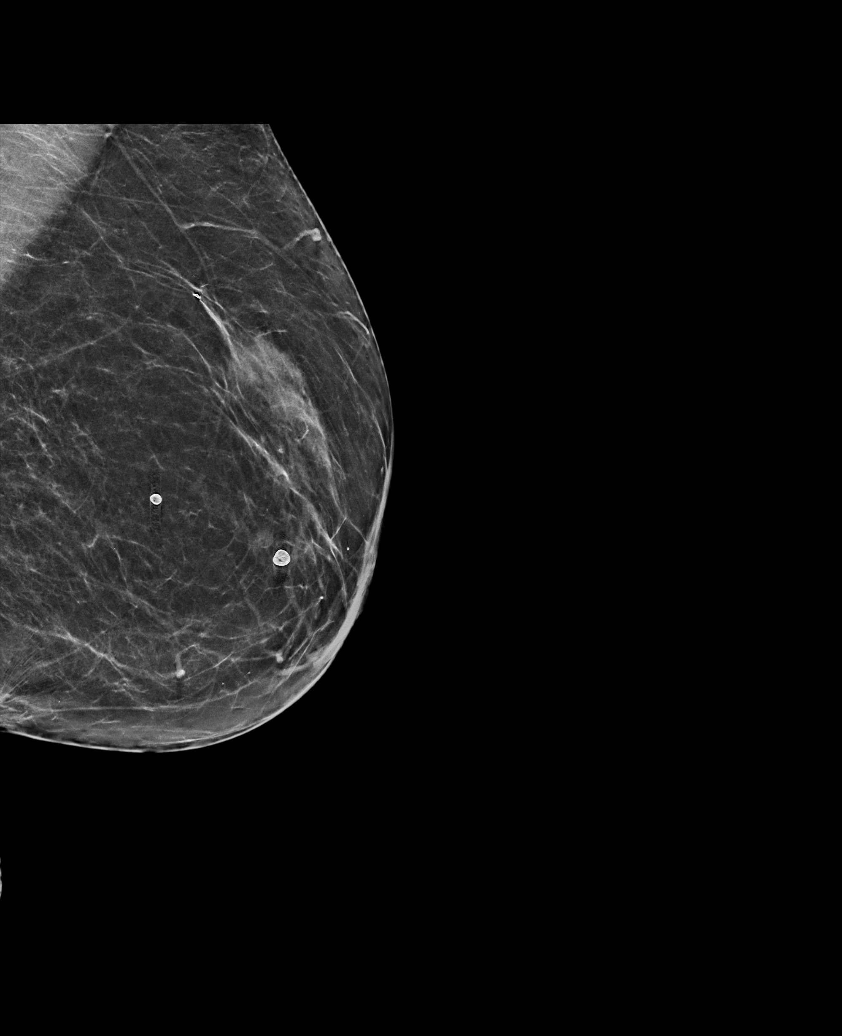

[R CC synth-2D]
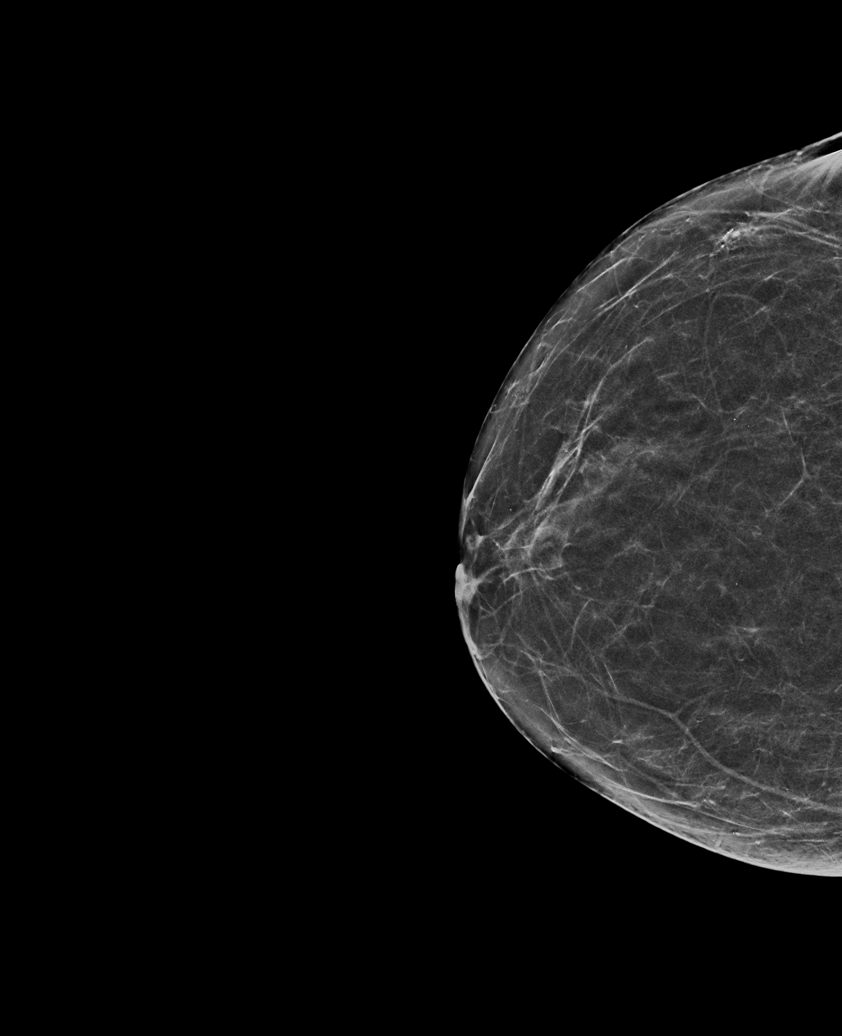

[R MLO synth-2D (1 of 2)]
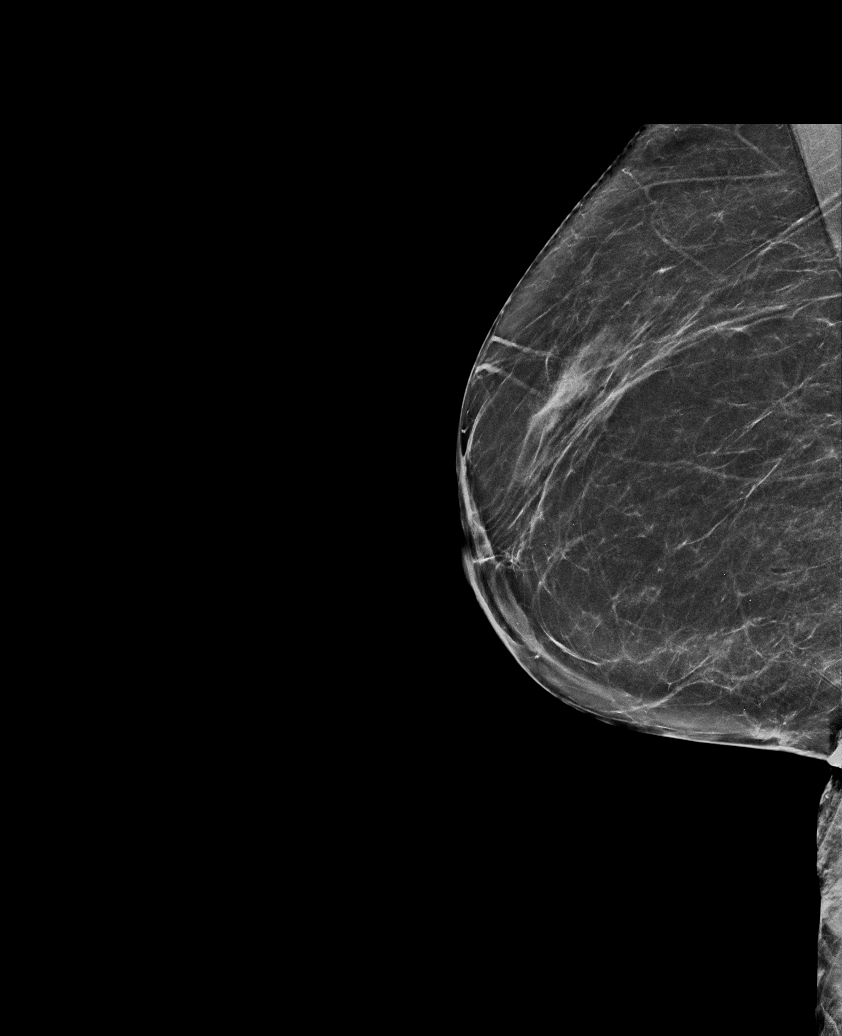

[L MLO synth-2D (2 of 2)]
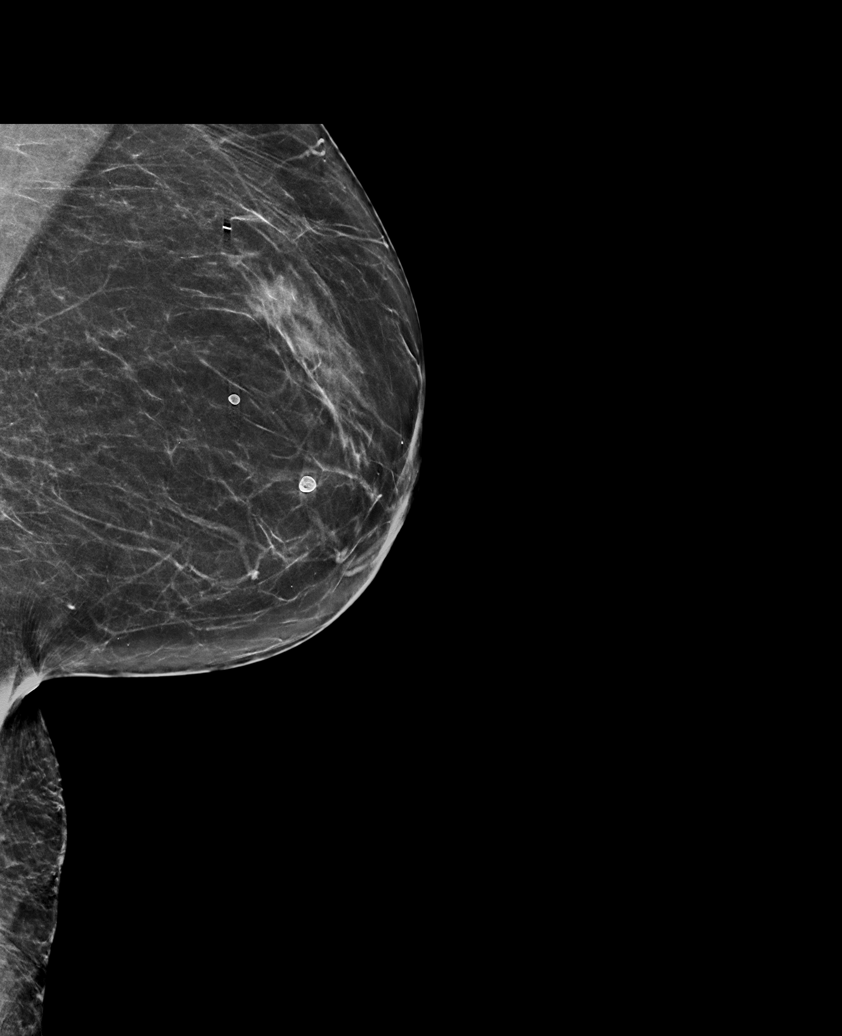

[R MLO synth-2D (2 of 2)]
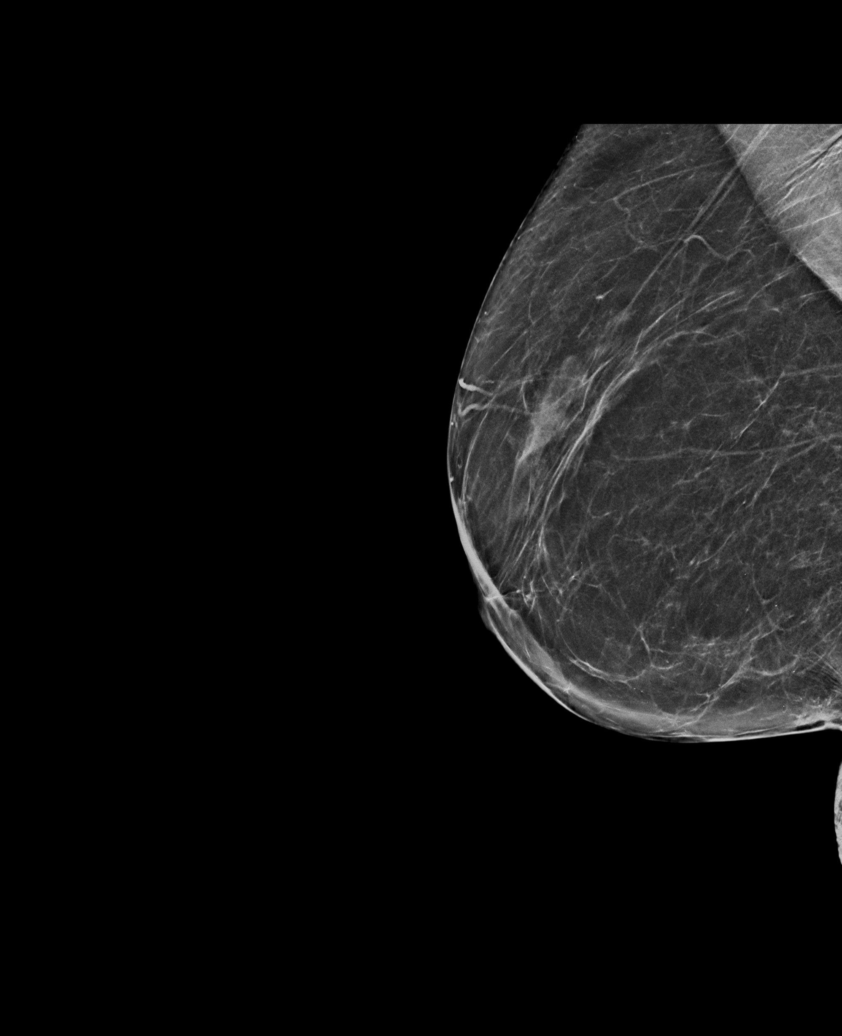

[L CC synth-2D]
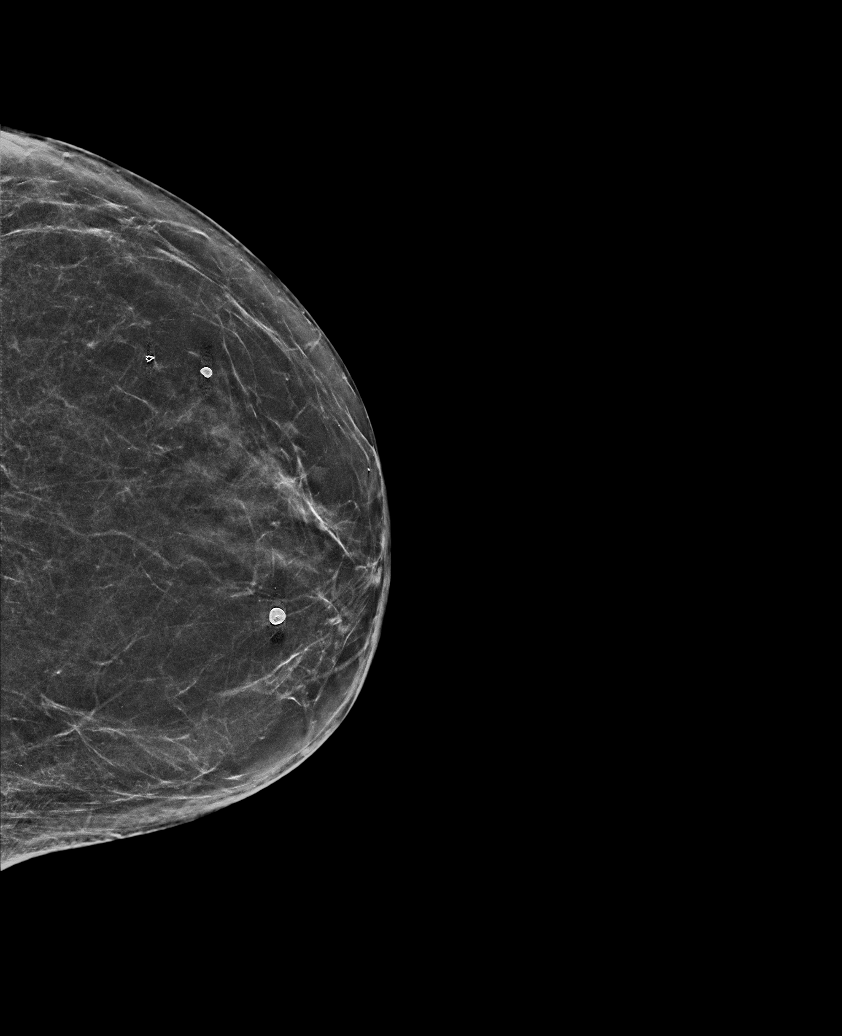

[6 of 36 positions shown; findings below may reference images not displayed]

ACR Breast Density Category b: There are scattered areas of
fibroglandular density.
FINDINGS: There are no findings suspicious for malignancy.
IMPRESSION: No mammographic evidence of malignancy. A result letter of this
screening mammogram will be mailed directly to the patient.

RECOMMENDATION:
Screening mammogram in one year. (Code:51-O-LD2)

BI-RADS CATEGORY  1: Negative.

## 2022-11-11 ENCOUNTER — Encounter (INDEPENDENT_AMBULATORY_CARE_PROVIDER_SITE_OTHER): Payer: Self-pay | Admitting: *Deleted

## 2022-11-12 ENCOUNTER — Ambulatory Visit (INDEPENDENT_AMBULATORY_CARE_PROVIDER_SITE_OTHER): Payer: Medicare HMO | Admitting: Gastroenterology

## 2022-11-12 ENCOUNTER — Encounter (INDEPENDENT_AMBULATORY_CARE_PROVIDER_SITE_OTHER): Payer: Self-pay | Admitting: Gastroenterology

## 2022-11-12 VITALS — BP 175/81 | HR 72 | Temp 98.1°F | Ht 61.0 in | Wt 142.0 lb

## 2022-11-12 DIAGNOSIS — I10 Essential (primary) hypertension: Secondary | ICD-10-CM | POA: Insufficient documentation

## 2022-11-12 DIAGNOSIS — K921 Melena: Secondary | ICD-10-CM | POA: Insufficient documentation

## 2022-11-12 DIAGNOSIS — K219 Gastro-esophageal reflux disease without esophagitis: Secondary | ICD-10-CM | POA: Diagnosis not present

## 2022-11-12 DIAGNOSIS — Z8719 Personal history of other diseases of the digestive system: Secondary | ICD-10-CM

## 2022-11-12 MED ORDER — PEG 3350-KCL-NA BICARB-NACL 420 G PO SOLR: 4000.0000 mL | Freq: Once | ORAL | 0 refills | Status: AC

## 2022-11-12 NOTE — Patient Instructions (Signed)
It was very nice to meet you today, as dicussed with will plan for the following :  1) Colonoscopy

## 2022-11-12 NOTE — H&P (View-Only) (Signed)
Vista Lawman , M.D. Gastroenterology & Hepatology [facility] [address] [place], [place] [address] Primary Care Physician: Roe Rutherford, NP [address] [place] [address]  Chief Complaint:  hematochezia   History of Present Illness: Kathleen Keller is a 82 y.o. female with hypertension, moderate carotid stenosis bilaterally , osteoarthritis, chronic back pain with who presents for evaluation of painless hematochezia  Patient reports that for past 1 week she has been having intermittent fresh blood per rectum.  Sometimes it drips in the toilet bowl at the time it is mixed with stool.  Denies any abdominal pain or defecating.  Patient went to primary care where rectal exam demonstrated hemorrhoids and was given corticosteroid suppository and ciprofloxacin. The patient denies having any nausea, vomiting, fever, chills, melena, hematemesis, abdominal distention, abdominal pain, diarrhea, jaundice, pruritus or weight loss.  Last NWG:NFAO years ago , had Hiatal hernia repair  Last Colonoscopy:over 7 years ago at [facility]   FHx: neg for any gastrointestinal/liver disease, no malignancies Social: neg smoking, alcohol or illicit drug use Surgical: Hysterectomy  Past Medical History: Past Medical History:  Diagnosis Date   Arthritis    Essential hypertension    GERD with esophagitis    Hypertension    Iron deficiency anemia    Leucopenia    MDD (major depressive disorder)    OAB (overactive bladder)    Thyroid disease    Vertigo    Vitamin B12 deficiency     Past Surgical History: Past Surgical History:  Procedure Laterality Date   KNEE SURGERY     REDUCTION MAMMAPLASTY Bilateral    TONSILLECTOMY     VAGINAL HYSTERECTOMY      Family History:No family history on file.  Social History: Social History   Tobacco Use  Smoking Status Never  Smokeless Tobacco Never   Social History   Substance and  Sexual Activity  Alcohol Use Not Currently   Social History   Substance and Sexual Activity  Drug Use Not Currently    Allergies: Allergies  Allergen Reactions   Codeine Nausea And Vomiting   Demerol [Meperidine Hcl] Nausea And Vomiting   Penicillins Anaphylaxis   Propoxyphene Nausea And Vomiting    Medications: Current Outpatient Medications  Medication Sig Dispense Refill   ALPRAZolam (XANAX) 0.25 MG tablet Take by mouth.     aspirin 81 MG EC tablet Take by mouth.     CHOLECALCIFEROL PO Take 2,000 Units by mouth daily.     ciprofloxacin (CIPRO) 500 MG tablet Take 500 mg by mouth 2 (two) times daily.     cyanocobalamin (,VITAMIN B-12,) 1000 MCG/ML injection Inject into the muscle every 30 (thirty) days.     levothyroxine (SYNTHROID) 88 MCG tablet Take by mouth.     lisinopril (ZESTRIL) 10 MG tablet Take 10 mg by mouth daily.     omeprazole (PRILOSEC) 40 MG capsule Take 40 mg by mouth 2 (two) times daily.     ondansetron (ZOFRAN) 4 MG tablet Take 1 tablet (4 mg total) by mouth every 8 (eight) hours as needed for nausea or vomiting. 20 tablet 0   No current facility-administered medications for this visit.    Review of Systems: GENERAL: negative for malaise, night sweats HEENT: No changes in hearing or vision, no nose bleeds or other nasal problems. NECK: Negative for lumps, goiter, pain and significant neck swelling RESPIRATORY: Negative for cough, wheezing CARDIOVASCULAR: Negative for chest pain, leg swelling, palpitations,  orthopnea GI: SEE HPI MUSCULOSKELETAL: Negative for joint pain or swelling, back pain, and muscle pain. SKIN: Negative for lesions, rash HEMATOLOGY Negative for prolonged bleeding, bruising easily, and swollen nodes. ENDOCRINE: Negative for cold or heat intolerance, polyuria, polydipsia and goiter. NEURO: negative for tremor, gait imbalance, syncope and seizures. The remainder of the review of systems is noncontributory.   Physical Exam: BP (!)  175/81   Pulse 72   Temp 98.1 F (36.7 C) (Oral)   Ht 5\' 1"  (1.549 m)   Wt 142 lb (64.4 kg)   BMI 26.83 kg/m  GENERAL: The patient is AO x3, in no acute distress. HEENT: Head is normocephalic and atraumatic. EOMI are intact. Mouth is well hydrated and without lesions. NECK: Supple. No masses LUNGS: Clear to auscultation. No presence of rhonchi/wheezing/rales. Adequate chest expansion HEART: RRR, normal s1 and s2. ABDOMEN: Soft, nontender, no guarding, no peritoneal signs, and nondistended. BS +. No masses.    Imaging/Labs: as above     Latest Ref Rng & Units 11/23/2020   10:41 PM  CBC  WBC 4.0 - 10.5 K/uL 9.4   Hemoglobin 12.0 - 15.0 g/dL 16.1   Hematocrit 09.6 - 46.0 % 39.8   Platelets 150 - 400 K/uL 277    No results found for: "IRON", "TIBC", "FERRITIN"  I personally reviewed and interpreted the available labs, imaging and endoscopic files.  7/30 CT Angio Abdomen   IMPRESSION:  1. No acute process.  2. Moderate size hiatal hernia.  3. Additional findings as detailed above.   CBC done yesterday hemoglobin 10.7 MCV 90 platelet 299 Hemoglobin 06/2022 11.9  Iron profile from 06/2022 iron saturation of 12 TIBC 334 Ferritin from January 2000 24-12 vitamin B12 536 folate 5.3 hemoglobin 12.3  Impression and Plan:  Kathleen Keller is a 82 y.o. female with hypertension, moderate carotid stenosis bilaterally , osteoarthritis, chronic back pain with who presents for evaluation of painless hematochezia  #Painless hematochezia   This could be diverticular, hemorrhoidal bleed but cannot rule out malignancy  Patient is of advanced age last colonoscopy was 7 years ago currently seen blood per rectum which is an alarm symptom  Hemoglobin baseline 12 with a drop to 10.7.  Currently hemodynamically stable and abdominal exam benign without any tenderness  Last CTA done 3 months ago  I discussed with patient the likely cause of painless hematochezia including diverticular bleed  and hemorrhoidal bleed without colonoscopy malignancy cannot be excluded.  After risk-benefit and alternative discussion patient would like to pursue diagnostic colonoscopy  ER precautions given for any fever chills severe abdominal pain or large amount of blood per rectum  #GERD  Patient had history of hiatal hernia s/p repair and currently GERD symptoms are well-controlled  #Hypertension  The patient was found to have elevated blood pressure when vital signs were checked in the office. The blood pressure was rechecked by the nursing staff and it was found be persistently elevated >140/90 mmHg. I personally advised to the patient to follow up closely with PCP for hypertension control.   All questions were answered.      Vista Lawman, MD Gastroenterology and Hepatology [facility]   This chart has been completed using [facility] Dictation software, and while attempts have been made to ensure accuracy , certain words and phrases may not be transcribed as intended

## 2022-11-12 NOTE — Progress Notes (Signed)
Kathleen Keller , M.D. Gastroenterology & Hepatology [facility] [address] [place], [place] [address] Primary Care Physician: Roe Rutherford, NP [address] [place] [address]  Chief Complaint:  hematochezia   History of Present Illness: Kathleen Keller is a 82 y.o. female with hypertension, moderate carotid stenosis bilaterally , osteoarthritis, chronic back pain with who presents for evaluation of painless hematochezia  Patient reports that for past 1 week she has been having intermittent fresh blood per rectum.  Sometimes it drips in the toilet bowl at the time it is mixed with stool.  Denies any abdominal pain or defecating.  Patient went to primary care where rectal exam demonstrated hemorrhoids and was given corticosteroid suppository and ciprofloxacin. The patient denies having any nausea, vomiting, fever, chills, melena, hematemesis, abdominal distention, abdominal pain, diarrhea, jaundice, pruritus or weight loss.  Last NWG:NFAO years ago , had Hiatal hernia repair  Last Colonoscopy:over 7 years ago at [facility]   FHx: neg for any gastrointestinal/liver disease, no malignancies Social: neg smoking, alcohol or illicit drug use Surgical: Hysterectomy  Past Medical History: Past Medical History:  Diagnosis Date   Arthritis    Essential hypertension    GERD with esophagitis    Hypertension    Iron deficiency anemia    Leucopenia    MDD (major depressive disorder)    OAB (overactive bladder)    Thyroid disease    Vertigo    Vitamin B12 deficiency     Past Surgical History: Past Surgical History:  Procedure Laterality Date   KNEE SURGERY     REDUCTION MAMMAPLASTY Bilateral    TONSILLECTOMY     VAGINAL HYSTERECTOMY      Family History:No family history on file.  Social History: Social History   Tobacco Use  Smoking Status Never  Smokeless Tobacco Never   Social History   Substance and  Sexual Activity  Alcohol Use Not Currently   Social History   Substance and Sexual Activity  Drug Use Not Currently    Allergies: Allergies  Allergen Reactions   Codeine Nausea And Vomiting   Demerol [Meperidine Hcl] Nausea And Vomiting   Penicillins Anaphylaxis   Propoxyphene Nausea And Vomiting    Medications: Current Outpatient Medications  Medication Sig Dispense Refill   ALPRAZolam (XANAX) 0.25 MG tablet Take by mouth.     aspirin 81 MG EC tablet Take by mouth.     CHOLECALCIFEROL PO Take 2,000 Units by mouth daily.     ciprofloxacin (CIPRO) 500 MG tablet Take 500 mg by mouth 2 (two) times daily.     cyanocobalamin (,VITAMIN B-12,) 1000 MCG/ML injection Inject into the muscle every 30 (thirty) days.     levothyroxine (SYNTHROID) 88 MCG tablet Take by mouth.     lisinopril (ZESTRIL) 10 MG tablet Take 10 mg by mouth daily.     omeprazole (PRILOSEC) 40 MG capsule Take 40 mg by mouth 2 (two) times daily.     ondansetron (ZOFRAN) 4 MG tablet Take 1 tablet (4 mg total) by mouth every 8 (eight) hours as needed for nausea or vomiting. 20 tablet 0   No current facility-administered medications for this visit.    Review of Systems: GENERAL: negative for malaise, night sweats HEENT: No changes in hearing or vision, no nose bleeds or other nasal problems. NECK: Negative for lumps, goiter, pain and significant neck swelling RESPIRATORY: Negative for cough, wheezing CARDIOVASCULAR: Negative for chest pain, leg swelling, palpitations,  orthopnea GI: SEE HPI MUSCULOSKELETAL: Negative for joint pain or swelling, back pain, and muscle pain. SKIN: Negative for lesions, rash HEMATOLOGY Negative for prolonged bleeding, bruising easily, and swollen nodes. ENDOCRINE: Negative for cold or heat intolerance, polyuria, polydipsia and goiter. NEURO: negative for tremor, gait imbalance, syncope and seizures. The remainder of the review of systems is noncontributory.   Physical Exam: BP (!)  175/81   Pulse 72   Temp 98.1 F (36.7 C) (Oral)   Ht 5\' 1"  (1.549 m)   Wt 142 lb (64.4 kg)   BMI 26.83 kg/m  GENERAL: The patient is AO x3, in no acute distress. HEENT: Head is normocephalic and atraumatic. EOMI are intact. Mouth is well hydrated and without lesions. NECK: Supple. No masses LUNGS: Clear to auscultation. No presence of rhonchi/wheezing/rales. Adequate chest expansion HEART: RRR, normal s1 and s2. ABDOMEN: Soft, nontender, no guarding, no peritoneal signs, and nondistended. BS +. No masses.    Imaging/Labs: as above     Latest Ref Rng & Units 11/23/2020   10:41 PM  CBC  WBC 4.0 - 10.5 K/uL 9.4   Hemoglobin 12.0 - 15.0 g/dL 16.1   Hematocrit 09.6 - 46.0 % 39.8   Platelets 150 - 400 K/uL 277    No results found for: "IRON", "TIBC", "FERRITIN"  I personally reviewed and interpreted the available labs, imaging and endoscopic files.  7/30 CT Angio Abdomen   IMPRESSION:  1. No acute process.  2. Moderate size hiatal hernia.  3. Additional findings as detailed above.   CBC done yesterday hemoglobin 10.7 MCV 90 platelet 299 Hemoglobin 06/2022 11.9  Iron profile from 06/2022 iron saturation of 12 TIBC 334 Ferritin from January 2000 24-12 vitamin B12 536 folate 5.3 hemoglobin 12.3  Impression and Plan:  Kathleen Keller is a 82 y.o. female with hypertension, moderate carotid stenosis bilaterally , osteoarthritis, chronic back pain with who presents for evaluation of painless hematochezia  #Painless hematochezia   This could be diverticular, hemorrhoidal bleed but cannot rule out malignancy  Patient is of advanced age last colonoscopy was 7 years ago currently seen blood per rectum which is an alarm symptom  Hemoglobin baseline 12 with a drop to 10.7.  Currently hemodynamically stable and abdominal exam benign without any tenderness  Last CTA done 3 months ago  I discussed with patient the likely cause of painless hematochezia including diverticular bleed  and hemorrhoidal bleed without colonoscopy malignancy cannot be excluded.  After risk-benefit and alternative discussion patient would like to pursue diagnostic colonoscopy  ER precautions given for any fever chills severe abdominal pain or large amount of blood per rectum  #GERD  Patient had history of hiatal hernia s/p repair and currently GERD symptoms are well-controlled  #Hypertension  The patient was found to have elevated blood pressure when vital signs were checked in the office. The blood pressure was rechecked by the nursing staff and it was found be persistently elevated >140/90 mmHg. I personally advised to the patient to follow up closely with PCP for hypertension control.   All questions were answered.      Kathleen Lawman, MD Gastroenterology and Hepatology [facility]   This chart has been completed using [facility] Dictation software, and while attempts have been made to ensure accuracy , certain words and phrases may not be transcribed as intended

## 2022-12-01 ENCOUNTER — Encounter (HOSPITAL_COMMUNITY)
Admission: RE | Disposition: A | Discharge status: Home / Self Care | Payer: Self-pay | Source: Home / Self Care | Attending: Gastroenterology

## 2022-12-01 ENCOUNTER — Other Ambulatory Visit: Payer: Self-pay

## 2022-12-01 ENCOUNTER — Ambulatory Visit (HOSPITAL_COMMUNITY): Payer: Medicare HMO | Admitting: Anesthesiology

## 2022-12-01 ENCOUNTER — Ambulatory Visit (HOSPITAL_BASED_OUTPATIENT_CLINIC_OR_DEPARTMENT_OTHER): Payer: Medicare HMO | Admitting: Anesthesiology

## 2022-12-01 ENCOUNTER — Encounter (HOSPITAL_COMMUNITY): Payer: Self-pay

## 2022-12-01 ENCOUNTER — Ambulatory Visit (HOSPITAL_COMMUNITY)
Admission: RE | Admit: 2022-12-01 | Discharge: 2022-12-01 | Disposition: A | Discharge status: Home / Self Care | Payer: Medicare HMO | Attending: Gastroenterology | Admitting: Gastroenterology

## 2022-12-01 ENCOUNTER — Encounter (INDEPENDENT_AMBULATORY_CARE_PROVIDER_SITE_OTHER): Payer: Self-pay | Admitting: *Deleted

## 2022-12-01 DIAGNOSIS — K921 Melena: Secondary | ICD-10-CM | POA: Diagnosis present

## 2022-12-01 DIAGNOSIS — K5730 Diverticulosis of large intestine without perforation or abscess without bleeding: Secondary | ICD-10-CM | POA: Insufficient documentation

## 2022-12-01 DIAGNOSIS — D125 Benign neoplasm of sigmoid colon: Secondary | ICD-10-CM | POA: Diagnosis not present

## 2022-12-01 DIAGNOSIS — K219 Gastro-esophageal reflux disease without esophagitis: Secondary | ICD-10-CM | POA: Insufficient documentation

## 2022-12-01 DIAGNOSIS — D122 Benign neoplasm of ascending colon: Secondary | ICD-10-CM | POA: Diagnosis not present

## 2022-12-01 DIAGNOSIS — M549 Dorsalgia, unspecified: Secondary | ICD-10-CM | POA: Diagnosis not present

## 2022-12-01 DIAGNOSIS — M1990 Unspecified osteoarthritis, unspecified site: Secondary | ICD-10-CM | POA: Insufficient documentation

## 2022-12-01 DIAGNOSIS — I10 Essential (primary) hypertension: Secondary | ICD-10-CM | POA: Insufficient documentation

## 2022-12-01 DIAGNOSIS — G8929 Other chronic pain: Secondary | ICD-10-CM | POA: Insufficient documentation

## 2022-12-01 DIAGNOSIS — K625 Hemorrhage of anus and rectum: Secondary | ICD-10-CM

## 2022-12-01 DIAGNOSIS — K635 Polyp of colon: Secondary | ICD-10-CM

## 2022-12-01 DIAGNOSIS — K644 Residual hemorrhoidal skin tags: Secondary | ICD-10-CM | POA: Insufficient documentation

## 2022-12-01 DIAGNOSIS — I6523 Occlusion and stenosis of bilateral carotid arteries: Secondary | ICD-10-CM | POA: Insufficient documentation

## 2022-12-01 DIAGNOSIS — K648 Other hemorrhoids: Secondary | ICD-10-CM | POA: Insufficient documentation

## 2022-12-01 HISTORY — PX: COLONOSCOPY WITH PROPOFOL: SHX5780

## 2022-12-01 HISTORY — PX: POLYPECTOMY: SHX5525

## 2022-12-01 LAB — HM COLONOSCOPY

## 2022-12-01 SURGERY — COLONOSCOPY WITH PROPOFOL
Anesthesia: General

## 2022-12-01 MED ORDER — PROPOFOL 500 MG/50ML IV EMUL
INTRAVENOUS | Status: DC | PRN
  Administered 2022-12-01: 100 ug/kg/min via INTRAVENOUS

## 2022-12-01 MED ORDER — SODIUM CHLORIDE 0.9% FLUSH: 10.0000 mL | Freq: Two times a day (BID) | INTRAVENOUS | Status: DC

## 2022-12-01 MED ORDER — PROPOFOL 10 MG/ML IV BOLUS
INTRAVENOUS | Status: DC | PRN
  Administered 2022-12-01: 20 mg via INTRAVENOUS
  Administered 2022-12-01: 100 mg via INTRAVENOUS

## 2022-12-01 MED ORDER — LACTATED RINGERS IV SOLN: INTRAVENOUS | Status: DC | PRN

## 2022-12-01 NOTE — Anesthesia Preprocedure Evaluation (Signed)
Anesthesia Evaluation  Patient identified by MRN, date of birth, ID band Patient awake    Reviewed: Allergy & Precautions, H&P , NPO status , Patient's Chart, lab work & pertinent test results, reviewed documented beta blocker date and time   Airway Mallampati: II  TM Distance: >3 FB Neck ROM: full    Dental no notable dental hx.    Pulmonary neg pulmonary ROS   Pulmonary exam normal breath sounds clear to auscultation       Cardiovascular Exercise Tolerance: Good hypertension, negative cardio ROS  Rhythm:regular Rate:Normal     Neuro/Psych  PSYCHIATRIC DISORDERS  Depression    negative neurological ROS  negative psych ROS   GI/Hepatic negative GI ROS, Neg liver ROS,GERD  ,,  Endo/Other  negative endocrine ROS    Renal/GU negative Renal ROS  negative genitourinary   Musculoskeletal   Abdominal   Peds  Hematology negative hematology ROS (+) Blood dyscrasia, anemia   Anesthesia Other Findings   Reproductive/Obstetrics negative OB ROS                             Anesthesia Physical Anesthesia Plan  ASA: 2  Anesthesia Plan: General   Post-op Pain Management:    Induction:   PONV Risk Score and Plan: Propofol infusion  Airway Management Planned:   Additional Equipment:   Intra-op Plan:   Post-operative Plan:   Informed Consent: I have reviewed the patients History and Physical, chart, labs and discussed the procedure including the risks, benefits and alternatives for the proposed anesthesia with the patient or authorized representative who has indicated his/her understanding and acceptance.     Dental Advisory Given  Plan Discussed with: CRNA  Anesthesia Plan Comments:        Anesthesia Quick Evaluation

## 2022-12-01 NOTE — Anesthesia Procedure Notes (Signed)
Date/Time: 12/01/2022 10:22 AM  Performed by: Franco Nones, CRNAPre-anesthesia Checklist: Patient identified, Emergency Drugs available, Suction available, Timeout performed and Patient being monitored Patient Re-evaluated:Patient Re-evaluated prior to induction Oxygen Delivery Method: Nasal Cannula

## 2022-12-01 NOTE — Op Note (Addendum)
[facility] Patient Name: Kathleen Keller Procedure Date: 12/01/2022 10:17 AM MRN: [id number] Date of Birth: 03-29-1940 Attending MD: Sanjuan Dame , MD, [id number] CSN: [id number] Age: 82 Admit Type: Outpatient Procedure:                Colonoscopy Indications:              Hematochezia Providers:                Sanjuan Dame, MD, Sheran Fava, Zena Amos Referring MD:              Medicines:                Monitored Anesthesia Care Complications:            No immediate complications. Estimated Blood Loss:     Estimated blood loss: none. Procedure:                Pre-Anesthesia Assessment:                           - Prior to the procedure, a History and Physical                            was performed, and patient medications and                            allergies were reviewed. The patient's tolerance of                            previous anesthesia was also reviewed. The risks                            and benefits of the procedure and the sedation                            options and risks were discussed with the patient.                            All questions were answered, and informed consent                            was obtained. Prior Anticoagulants: The patient has                            taken no anticoagulant or antiplatelet agents. ASA                            Grade Assessment: II - A patient with mild systemic                            disease. After reviewing the risks and benefits,                            the patient was  deemed in satisfactory condition to                            undergo the procedure.                           After obtaining informed consent, the colonoscope                            was passed under direct vision. Throughout the                            procedure, the patient's blood pressure, pulse, and                            oxygen saturations were monitored continuously.  The                            [id number]-[id number] ([id number]) scope was introduced through                            the anus and advanced to the the terminal ileum.                            The colonoscopy was performed without difficulty.                            The patient tolerated the procedure well. The                            quality of the bowel preparation was evaluated                            using the BBPS [facility] Bowel Preparation Scale)                            with scores of: Right Colon = 3, Transverse Colon =                            3 and Left Colon = 3 (entire mucosa seen well with                            no residual staining, small fragments of stool or                            opaque liquid). The total BBPS score equals 9. The                            terminal ileum, ileocecal valve, appendiceal                            orifice, and rectum were photographed. Scope In: 10:27:39 AM Scope Out: 10:50:34 AM Scope Withdrawal Time: 0 hours 18 minutes 50 seconds  Total Procedure Duration: 0 hours 22 minutes 55 seconds  Findings:      The perianal and digital rectal examinations were normal.      Scattered medium-mouthed diverticula were found in the entire colon.      The terminal ileum appeared normal.      Non-bleeding external and internal hemorrhoids were found during       retroflexion. The hemorrhoids were small.      A 4 mm polyp was found in the ascending colon. The polyp was sessile.       The polyp was removed with a cold snare. Resection and retrieval were       complete. Impression:               - Diverticulosis in the entire examined colon.                           - The examined portion of the ileum was normal.                           - Non-bleeding external and internal hemorrhoids.                           - One 4 mm polyp in the ascending colon, removed                            with a cold snare. Resected and retrieved. Moderate  Sedation:      Per Anesthesia Care Recommendation:           - Patient has a contact number available for                            emergencies. The signs and symptoms of potential                            delayed complications were discussed with the                            patient. Return to normal activities tomorrow.                            Written discharge instructions were provided to the                            patient.                           - Resume previous diet.                           - Continue present medications.                           - No repeat colonoscopy due to current age (35                            years or older).                           -  Return to primary care physician as previously                            scheduled.                           -High fiber diet                           -Likely cause of hematochezia was diverticular bleed Procedure Code(s):        --- Professional ---                           [phone], Colonoscopy, flexible; with removal of                            tumor(s), polyp(s), or other lesion(s) by snare                            technique Diagnosis Code(s):        --- Professional ---                           K64.8, Other hemorrhoids                           D12.2, Benign neoplasm of ascending colon                           K92.1, Melena (includes Hematochezia)                           K57.30, Diverticulosis of large intestine without                            perforation or abscess without bleeding CPT copyright 2022 American Medical Association. All rights reserved. The codes documented in this report are preliminary and upon coder review may  be revised to meet current compliance requirements. Sanjuan Dame, MD Sanjuan Dame, MD 12/01/2022 11:03:14 AM This report has been signed electronically. Number of Addenda: 0

## 2022-12-01 NOTE — Interval H&P Note (Signed)
History and Physical Interval Note:  12/01/2022 10:11 AM  Kathleen Keller  has presented today for surgery, with the diagnosis of HEMATOCHEZIA.  The various methods of treatment have been discussed with the patient and family. After consideration of risks, benefits and other options for treatment, the patient has consented to  Procedure(s) with comments: COLONOSCOPY WITH PROPOFOL (N/A) - 11:15AM;ASA 1-2 as a surgical intervention.  The patient's history has been reviewed, patient examined, no change in status, stable for surgery.  I have reviewed the patient's chart and labs.  Questions were answered to the patient's satisfaction.     Kathleen Keller Kathleen Keller

## 2022-12-01 NOTE — Transfer of Care (Signed)
Immediate Anesthesia Transfer of Care Note  Patient: Kathleen Keller  Procedure(s) Performed: COLONOSCOPY WITH PROPOFOL POLYPECTOMY  Patient Location: Endoscopy Unit  Anesthesia Type:General  Level of Consciousness: awake and patient cooperative  Airway & Oxygen Therapy: Patient Spontanous Breathing  Post-op Assessment: Report given to RN and Post -op Vital signs reviewed and stable  Post vital signs: Reviewed and stable  Last Vitals:  Vitals Value Taken Time  BP 132/45 12/01/22 1054  Temp 36.5 C 12/01/22 1054  Pulse 72 12/01/22 1054  Resp 20 12/01/22 1054  SpO2 100 % 12/01/22 1054    Last Pain:  Vitals:   12/01/22 1054  TempSrc: Oral  PainSc: 0-No pain      Patients Stated Pain Goal: 10 (12/01/22 0958)  Complications: No notable events documented.

## 2022-12-01 NOTE — Discharge Instructions (Signed)
  Discharge instructions Please read the instructions outlined below and refer to this sheet in the next few weeks. These discharge instructions provide you with general information on caring for yourself after you leave the hospital. Your doctor may also give you specific instructions. While your treatment has been planned according to the most current medical practices available, unavoidable complications occasionally occur. If you have any problems or questions after discharge, please call your doctor. ACTIVITY You may resume your regular activity but move at a slower pace for the next 24 hours.  Take frequent rest periods for the next 24 hours.  Walking will help expel (get rid of) the air and reduce the bloated feeling in your abdomen.  No driving for 24 hours (because of the anesthesia (medicine) used during the test).  You may shower.  Do not sign any important legal documents or operate any machinery for 24 hours (because of the anesthesia used during the test).  NUTRITION Drink plenty of fluids.  You may resume your normal diet.  Begin with a light meal and progress to your normal diet.  Avoid alcoholic beverages for 24 hours or as instructed by your caregiver.  MEDICATIONS You may resume your normal medications unless your caregiver tells you otherwise.  WHAT YOU CAN EXPECT TODAY You may experience abdominal discomfort such as a feeling of fullness or "gas" pains.  FOLLOW-UP Your doctor will discuss the results of your test with you.  SEEK IMMEDIATE MEDICAL ATTENTION IF ANY OF THE FOLLOWING OCCUR: Excessive nausea (feeling sick to your stomach) and/or vomiting.  Severe abdominal pain and distention (swelling).  Trouble swallowing.  Temperature over 101 F (37.8 C).  Rectal bleeding or vomiting of blood.    Ensure adequate fluid intake: Aim for 8 glasses of water daily. Follow a high fiber diet: Include foods such as dates, prunes, pears, and kiwi.    I hope you have a great  rest of your week!   Vista Lawman , M.D.. Gastroenterology and Hepatology [facility]

## 2022-12-02 LAB — SURGICAL PATHOLOGY

## 2022-12-03 NOTE — Progress Notes (Signed)
I reviewed the pathology results. Ann, can you send her a letter with the findings as described below please?  No further screening colonoscopies recommended.  Thanks,  Vista Lawman, MD Gastroenterology and Hepatology [facility]  ---------------------------------------------------------------------------------------------  [facility] [address], [place], [place] [address] Phone:  [phone]   12/03/22 [place], [place]   Dear Kathleen Keller,  I am writing to let you know the results of your recent colonoscopy.  You had a total of 1 polyps removed. The pathology came back as "tubular adenoma." These findings are NOT cancer, but had the polyps remained in your colon, they could have turned into cancer.  Given these findings, it is recommended that your next colonoscopy be performed in 7 years, But  As per [place]  Multi-Society Task Force on Colorectal Cancer recommendation Colon cancer screening is not recommended after age 5.  Please call us at [phone] if you have persistent problems or have questions about your condition that have not been fully answered at this time.  Sincerely,  Vista Lawman, MD Gastroenterology and Hepatology

## 2022-12-04 ENCOUNTER — Encounter (HOSPITAL_COMMUNITY): Payer: Self-pay | Admitting: Gastroenterology

## 2022-12-04 ENCOUNTER — Encounter (INDEPENDENT_AMBULATORY_CARE_PROVIDER_SITE_OTHER): Payer: Self-pay | Admitting: *Deleted

## 2022-12-05 NOTE — Anesthesia Postprocedure Evaluation (Signed)
Anesthesia Post Note  Patient: Kathleen Keller  Procedure(s) Performed: COLONOSCOPY WITH PROPOFOL POLYPECTOMY  Patient location during evaluation: Phase II Anesthesia Type: General Level of consciousness: awake Pain management: pain level controlled Vital Signs Assessment: post-procedure vital signs reviewed and stable Respiratory status: spontaneous breathing and respiratory function stable Cardiovascular status: blood pressure returned to baseline and stable Postop Assessment: no headache and no apparent nausea or vomiting Anesthetic complications: no Comments: Late entry   No notable events documented.   Last Vitals:  Vitals:   12/01/22 1012 12/01/22 1054  BP: (!) 198/69 (!) 132/45  Pulse: 79 72  Resp: 19 20  Temp: 36.6 C 36.5 C  SpO2: 98% 100%    Last Pain:  Vitals:   12/01/22 1054  TempSrc: Oral  PainSc: 0-No pain                 Windell Norfolk

## 2022-12-11 ENCOUNTER — Ambulatory Visit (INDEPENDENT_AMBULATORY_CARE_PROVIDER_SITE_OTHER): Payer: Medicare HMO | Admitting: Gastroenterology

## 2023-02-10 ENCOUNTER — Ambulatory Visit (INDEPENDENT_AMBULATORY_CARE_PROVIDER_SITE_OTHER): Payer: 59 | Admitting: Gastroenterology

## 2023-02-10 ENCOUNTER — Encounter (INDEPENDENT_AMBULATORY_CARE_PROVIDER_SITE_OTHER): Payer: Self-pay | Admitting: Gastroenterology

## 2023-02-10 VITALS — BP 145/80 | HR 73 | Temp 98.2°F | Ht 61.0 in | Wt 140.0 lb

## 2023-02-10 DIAGNOSIS — R197 Diarrhea, unspecified: Secondary | ICD-10-CM | POA: Diagnosis not present

## 2023-02-10 NOTE — Patient Instructions (Addendum)
-  We will check stool studies to rule out infection -Continue activia -please make me aware of recurrence of rectal bleeding/lower abdominal pain  -high fiber diet once feeling back to baseline   Follow up 4 months   It was a pleasure to see you today. I want to create trusting relationships with patients and provide genuine, compassionate, and quality care. I truly value your feedback! please be on the lookout for a survey regarding your visit with me today. I appreciate your input about our visit and your time in completing this!    Trystyn Sitts L. Cheryn Lundquist, MSN, APRN, AGNP-C Adult-Gerontology Nurse Practitioner [facility] at [facility]

## 2023-02-10 NOTE — Progress Notes (Signed)
 Referring Provider: Suanne Pfeiffer, NP Primary Care Physician:  Suanne Pfeiffer, NP Primary GI Physician: Dr. Cinderella   Chief Complaint  Patient presents with   Rectal Bleeding    Patient here today due to having issues with rectal bleeding and was started on Metronidazole 500 mg one bid and Cipro 500 mg tid. She says she finished these yesterday. She says she was started on these by pcp due to thinking this was a bout of diverticulitis. She has brought a sample in today for you to see. Patient says she was having 8-9 loose stools per day.    HPI:   Kathleen Keller is a 83 y.o. female with past medical history of hypertension, moderate carotid stenosis bilaterally , osteoarthritis, chronic back pain   Patient presenting today for diarrhea   Last seen October 2024, at that time here for rectal bleeding x1 week, treated with steroids, cipro by PCP.  Recommended to proceed with Colonoscopy   Present:  Patient states she has been having upwards of 10 episodes of diarrhea per day since 1/4. She was started on antibiotics by PCP (cipro and flagyl x10 days) on 1/3 for suspected diverticulitis as she was passing blood and clots and having LLQ pain. She is not longer having rectal bleeding, this stopped after a couple days maybe though she cannot pinpoint how long this lasted. No current abdominal pain. She is now having about 4 BMs per day. She was advised to cut her cymbalta  down to once daily when she was started on antibiotics. No other medication changes or antibiotics. Denies fevers, chills, nausea or vomiting. Some nausea when on cipro and flagyl but this resolved after completion of these. Denies melena. Patient bring stool specimen with her today which appears light brown/somewhat seedy in nature. No overt blood or melena.    CTA Abd: 07/2022 -Liver:  Elongated right hepatic lobe. No suspicious lesion.  -Biliary: Unremarkable.  -Spleen:  Normal.  -Pancreas:  Normal.  -Adrenal glands:   Small bilateral adrenal nodules/nodular hyperplasia.  -Kidney:  Normal.  -Small bowel:  Grossly unremarkable.  -Colon:  Colonic diverticulosis without focal diverticulitis.  -Fluid:  No free fluid or fluid collection.  -Lymphadenopathy: None.  VASCULAR: Abdominal aorta: Patent without aneurysm, dissection, or critical stenosis. Atherosclerotic calcification present.         -Celiac artery:Patent.         -DFJ:Ejuzwu.         -PFJ:Ejuzwu.         -Renal Arteries:Patent bilaterally.         -Common Iliac arteries:Patent with mild narrowing about the proximal left common iliac artery.         -External and internal iliac arteries:Patent visualized segments.  Last Colonoscopy: 11/2022 diverticulosis in entire colon, examined portion of ileum normal, non bleeding external and internal hemorrhoids, 4mm polyp in ascending colon, removed  Last Endoscopy:  Recommendations:    Past Medical History:  Diagnosis Date   Arthritis    Essential hypertension    GERD with esophagitis    Hypertension    Iron deficiency anemia    Leucopenia    MDD (major depressive disorder)    OAB (overactive bladder)    Thyroid  disease    Vertigo    Vitamin B12 deficiency     Past Surgical History:  Procedure Laterality Date   COLONOSCOPY WITH PROPOFOL  N/A 12/01/2022   Procedure: COLONOSCOPY WITH PROPOFOL ;  Surgeon: Cinderella Deatrice FALCON, MD;  Location: [facility];  Service: Endoscopy;  Laterality: N/A;  11:15AM;ASA 1-2   KNEE SURGERY     POLYPECTOMY  12/01/2022   Procedure: POLYPECTOMY;  Surgeon: Cinderella Deatrice FALCON, MD;  Location: [facility];  Service: Endoscopy;;   REDUCTION MAMMAPLASTY Bilateral    TONSILLECTOMY     VAGINAL HYSTERECTOMY      Current Outpatient Medications  Medication Sig Dispense Refill   ALPRAZolam  (XANAX ) 0.25 MG tablet Take by mouth.     aspirin  81 MG EC tablet Take 81 mg by mouth daily.     CHOLECALCIFEROL PO Take 2,000 Units by mouth daily.     levothyroxine  (SYNTHROID ) 75  MCG tablet Take 75 mcg by mouth daily before breakfast.     lisinopril  (ZESTRIL ) 10 MG tablet Take 10 mg by mouth daily.     omeprazole (PRILOSEC) 40 MG capsule Take 40 mg by mouth 2 (two) times daily.     cyanocobalamin (,VITAMIN B-12,) 1000 MCG/ML injection Inject into the muscle every 30 (thirty) days. (Patient not taking: Reported on 02/10/2023)     No current facility-administered medications for this visit.    Allergies as of 02/10/2023 - Review Complete 02/10/2023  Allergen Reaction Noted   Codeine Nausea And Vomiting 10/04/2020   Demerol [meperidine hcl] Nausea And Vomiting 10/04/2020   Penicillins Anaphylaxis 10/04/2020   Propoxyphene Nausea And Vomiting 10/04/2020    History reviewed. No pertinent family history.  Social History   Socioeconomic History   Marital status: Divorced    Spouse name: Not on file   Number of children: Not on file   Years of education: Not on file   Highest education level: Not on file  Occupational History   Not on file  Tobacco Use   Smoking status: Never   Smokeless tobacco: Never  Vaping Use   Vaping status: Never Used  Substance and Sexual Activity   Alcohol use: Not Currently   Drug use: Not Currently   Sexual activity: Not on file  Other Topics Concern   Not on file  Social History Narrative   Not on file   Social Drivers of Health   Financial Resource Strain: Medium Risk (07/08/2022)   Received from [facility]   Overall Financial Resource Strain (CARDIA)    Difficulty of Paying Living Expenses: Somewhat hard  Food Insecurity: No Food Insecurity (07/08/2022)   Received from [facility]   Hunger Vital Sign    Worried About Running Out of Food in the Last Year: Never true    Ran Out of Food in the Last Year: Never true  Transportation Needs: No Transportation Needs (07/08/2022)   Received from [facility] - Transportation    Lack of Transportation (Medical): No    Lack of Transportation (Non-Medical): No   Physical Activity: Sufficiently Active (07/08/2022)   Received from [facility]   Exercise Vital Sign    Days of Exercise per Week: 4 days    Minutes of Exercise per Session: 90 min  Stress: Stress Concern Present (07/08/2022)   Received from [facility] - Occupational Stress Questionnaire    Feeling of Stress : Rather much  Social Connections: Moderately Integrated (07/08/2022)   [facility] from [facility]   Social Network    How would you rate your social network (family, work, friends)?: Adequate participation with social networks   Review of systems General: negative for malaise, night sweats, fever, chills, weight loss Neck: Negative for lumps, goiter, pain and significant neck swelling Resp:  Negative for cough, wheezing, dyspnea at rest CV: Negative for chest pain, leg swelling, palpitations, orthopnea GI: denies melena, hematochezia, nausea, vomiting, constipation, dysphagia, odyonophagia, early satiety or unintentional weight loss. +diarrhea . The remainder of the review of systems is noncontributory.  Physical Exam: BP (!) 145/80 (BP Location: Left Arm, Patient Position: Sitting, Cuff Size: Normal)   Pulse 73   Temp 98.2 F (36.8 C) (Temporal)   Ht 5' 1 (1.549 m)   Wt 140 lb (63.5 kg) Comment: Weight per patient verbally refused weight at office.  BMI 26.45 kg/m  General:   Alert and oriented. No distress noted. Pleasant and cooperative.  Head:  Normocephalic and atraumatic. Eyes:  Conjuctiva clear without scleral icterus. Mouth:  Oral mucosa pink and moist. Good dentition. No lesions. Heart: Normal rate and rhythm, s1 and s2 heart sounds present.  Lungs: Clear lung sounds in all lobes. Respirations equal and unlabored. Abdomen:  +BS, soft, non-tender and non-distended. No rebound or guarding. No HSM or masses noted. Neurologic:  Alert and  oriented x4 Psych:  Alert and cooperative. Normal mood and affect.  Invalid  input(s): 6 MONTHS   ASSESSMENT: Kathleen Keller is a 83 y.o. female presenting today for diarrhea  Patient recently treated for suspected diverticulitis by her PCP when she presented with rectal bleeding and left lower quadrant pain.  Started on course of Cipro and Flagyl x 10 days which she finished yesterday.  She notes shortly after starting antibiotic she developed upwards of 10 diarrhea-like stools per day which have now slowed to about 4 stools per day.  No further rectal bleeding or abdominal pain, no melena.  No fevers or chills.  Differentials at this time include diarrhea secondary to antibiotics, C. Difficile/other infectious etiology or postinfectious IBS, which I discussed in depth with the patient.  For now we will check C. difficile and GI pathogen panel.  She should continue with activity/daily probiotic.  For now should avoid Imodium or other antidiarrheals until infectious etiology is ruled out.  She should make me aware of recurrent left lower quadrant pain or rectal bleeding.  Discussed importance of high-fiber diet after resolution of her symptoms in order to prevent further episodes of diverticulitis.  She is up-to-date on colonoscopy, as outlined above.   PLAN:  -GI pathogen panel -C diff  -Continue activia -Pt to make me aware of recurrence of rectal bleeding/LLQ pain -High fiber diet once feeling better -be mindful of seeds/nuts   All questions were answered, patient verbalized understanding and is in agreement with plan as outlined above.   Follow Up: 4 months   Amarion Portell L. Ila Landowski, MSN, APRN, AGNP-C Adult-Gerontology Nurse Practitioner [facility]

## 2023-02-12 ENCOUNTER — Telehealth (INDEPENDENT_AMBULATORY_CARE_PROVIDER_SITE_OTHER): Payer: Self-pay | Admitting: Gastroenterology

## 2023-02-12 LAB — GASTROINTESTINAL PATHOGEN PNL
CampyloBacter Group: NOT DETECTED
Norovirus GI/GII: NOT DETECTED
Rotavirus A: NOT DETECTED
Salmonella species: NOT DETECTED
Shiga Toxin 1: NOT DETECTED
Shiga Toxin 2: NOT DETECTED
Shigella Species: NOT DETECTED
Vibrio Group: NOT DETECTED
Yersinia enterocolitica: NOT DETECTED

## 2023-02-12 LAB — C. DIFFICILE GDH AND TOXIN A/B
GDH ANTIGEN: NOT DETECTED
MICRO NUMBER:: 15956237
SPECIMEN QUALITY:: ADEQUATE
TOXIN A AND B: NOT DETECTED

## 2023-02-12 NOTE — Telephone Encounter (Signed)
I spoke with the patient and she was made aware, that stool studies are negative for infection. I suspect her diarrhea was secondary to the antibiotics, she should continue with daily probiotic and let me know if diarrhea is not better in about a week. Patient states understanding and would like to know.   #1 if she can take imodium now that she knows there is no infection.   #2 Wants to know if she needs her b 12 or any other lab work drawn.   Please advise.   Raquel James, NP 02/12/2023  8:56 AM EST     Please let her know that stool studies are negative for infection. I suspect her diarrhea was secondary to the antibiotics, she should continue with daily probiotic and let me know if diarrhea is not better in about a week.

## 2023-02-12 NOTE — Telephone Encounter (Signed)
Patient left voce mail message wanting to know if the test results have come in.  Please advise ph# [phone]

## 2023-02-12 NOTE — Telephone Encounter (Signed)
Patient had ov and labs done on 02/10/2023. Please advise.

## 2023-02-12 NOTE — Telephone Encounter (Signed)
I made the patient aware that per [facility], She can take imodium if needed, do not exceed 4 tablets/24 hours. B12 would not be related to diarrhea. She had thyroid testing and basic labs with her PCP not long ago that look normal. If diarrhea persists, we can perform other testing but as she was improving I suspect this is secondary to the antibiotics she was on. Patient states understanding.

## 2023-02-16 ENCOUNTER — Other Ambulatory Visit: Payer: Self-pay

## 2023-02-16 ENCOUNTER — Emergency Department (HOSPITAL_COMMUNITY): Payer: 59

## 2023-02-16 ENCOUNTER — Emergency Department (HOSPITAL_COMMUNITY)
Admission: EM | Admit: 2023-02-16 | Discharge: 2023-02-17 | Disposition: A | Discharge status: Home / Self Care | Payer: 59 | Attending: Emergency Medicine | Admitting: Emergency Medicine

## 2023-02-16 ENCOUNTER — Encounter (HOSPITAL_COMMUNITY): Payer: Self-pay

## 2023-02-16 DIAGNOSIS — R197 Diarrhea, unspecified: Secondary | ICD-10-CM | POA: Diagnosis not present

## 2023-02-16 DIAGNOSIS — S8002XA Contusion of left knee, initial encounter: Secondary | ICD-10-CM | POA: Diagnosis not present

## 2023-02-16 DIAGNOSIS — S0083XA Contusion of other part of head, initial encounter: Secondary | ICD-10-CM | POA: Diagnosis not present

## 2023-02-16 DIAGNOSIS — R531 Weakness: Secondary | ICD-10-CM | POA: Diagnosis not present

## 2023-02-16 DIAGNOSIS — Z7982 Long term (current) use of aspirin: Secondary | ICD-10-CM | POA: Diagnosis not present

## 2023-02-16 DIAGNOSIS — W108XXA Fall (on) (from) other stairs and steps, initial encounter: Secondary | ICD-10-CM | POA: Insufficient documentation

## 2023-02-16 DIAGNOSIS — S0993XA Unspecified injury of face, initial encounter: Secondary | ICD-10-CM | POA: Diagnosis present

## 2023-02-16 DIAGNOSIS — Z79899 Other long term (current) drug therapy: Secondary | ICD-10-CM | POA: Diagnosis not present

## 2023-02-16 LAB — CBC WITH DIFFERENTIAL/PLATELET
Abs Immature Granulocytes: 0.03 10*3/uL (ref 0.00–0.07)
Basophils Absolute: 0 10*3/uL (ref 0.0–0.1)
Basophils Relative: 1 %
Eosinophils Absolute: 0.2 10*3/uL (ref 0.0–0.5)
Eosinophils Relative: 3 %
HCT: 37 % (ref 36.0–46.0)
Hemoglobin: 11.9 g/dL — ABNORMAL LOW (ref 12.0–15.0)
Immature Granulocytes: 1 %
Lymphocytes Relative: 23 %
Lymphs Abs: 1.4 10*3/uL (ref 0.7–4.0)
MCH: 29.3 pg (ref 26.0–34.0)
MCHC: 32.2 g/dL (ref 30.0–36.0)
MCV: 91.1 fL (ref 80.0–100.0)
Monocytes Absolute: 0.5 10*3/uL (ref 0.1–1.0)
Monocytes Relative: 8 %
Neutro Abs: 4.1 10*3/uL (ref 1.7–7.7)
Neutrophils Relative %: 64 %
Platelets: 291 10*3/uL (ref 150–400)
RBC: 4.06 MIL/uL (ref 3.87–5.11)
RDW: 13.9 % (ref 11.5–15.5)
WBC: 6.2 10*3/uL (ref 4.0–10.5)
nRBC: 0 % (ref 0.0–0.2)

## 2023-02-16 LAB — COMPREHENSIVE METABOLIC PANEL
ALT: 17 U/L (ref 0–44)
AST: 23 U/L (ref 15–41)
Albumin: 3.9 g/dL (ref 3.5–5.0)
Alkaline Phosphatase: 54 U/L (ref 38–126)
Anion gap: 9 (ref 5–15)
BUN: 16 mg/dL (ref 8–23)
CO2: 27 mmol/L (ref 22–32)
Calcium: 9.2 mg/dL (ref 8.9–10.3)
Chloride: 100 mmol/L (ref 98–111)
Creatinine, Ser: 0.8 mg/dL (ref 0.44–1.00)
GFR, Estimated: >60 mL/min (ref >60)
Glucose, Bld: 112 mg/dL — ABNORMAL HIGH (ref 70–99)
Potassium: 3.5 mmol/L (ref 3.5–5.1)
Sodium: 136 mmol/L (ref 135–145)
Total Bilirubin: 0.6 mg/dL (ref 0.0–1.2)
Total Protein: 7.8 g/dL (ref 6.5–8.1)

## 2023-02-16 LAB — CBG MONITORING, ED: Glucose-Capillary: 104 mg/dL — ABNORMAL HIGH (ref 70–99)

## 2023-02-16 LAB — MAGNESIUM: Magnesium: 2 mg/dL (ref 1.7–2.4)

## 2023-02-16 MED ORDER — SODIUM CHLORIDE 0.9 % IV BOLUS
1000.0000 mL | Freq: Once | INTRAVENOUS | Status: AC
  Administered 2023-02-16: 1000 mL via INTRAVENOUS

## 2023-02-16 NOTE — ED Triage Notes (Signed)
Pt bib [facility] c/o fall after walking up the stairs tonight. Pt states she has been sick for around 3 weeks and weak x3 days. Pt fell and hit head and has knot to left forehead. No blood thinners.

## 2023-02-16 NOTE — ED Notes (Signed)
Pt was able to ambulate to bedside commode with minimal assistance.

## 2023-02-16 NOTE — ED Provider Notes (Signed)
[place] EMERGENCY DEPARTMENT AT [facility] Provider Note   CSN: [id number] Arrival date & time: 02/16/23  2217     History  Chief Complaint  Patient presents with   Kathleen Keller is a 83 y.o. female.  Presents to the emergency department for evaluation after a fall.  Patient reports that she walked up a flight of steps and fell when she got to the top, hitting her forehead on the ground.  No loss of consciousness.  She is experiencing left knee pain since the fall.  Patient reports that she has been very weak.  She recently completed a course of what sounds like Cipro and Flagyl for rectal bleeding and since taking those meds she has had persistent diarrhea.  Her doctor sent her for an outpatient C. difficile test that came back negative several days ago.       Home Medications Prior to Admission medications   Medication Sig Start Date End Date Taking? Authorizing Provider  diphenoxylate-atropine (LOMOTIL) 2.5-0.025 MG tablet Take 1-2 tablets by mouth 4 (four) times daily as needed for diarrhea or loose stools. 02/17/23  Yes Amy Belloso, Canary Brim, MD  ALPRAZolam Prudy Feeler) 0.25 MG tablet Take by mouth. 07/11/22   [provider]  aspirin 81 MG EC tablet Take 81 mg by mouth daily. 11/21/98   [provider]  CHOLECALCIFEROL PO Take 2,000 Units by mouth daily.    [provider]  cyanocobalamin (,VITAMIN B-12,) 1000 MCG/ML injection Inject into the muscle every 30 (thirty) days. Patient not taking: Reported on 02/10/2023 08/27/20   [provider]  levothyroxine (SYNTHROID) 75 MCG tablet Take 75 mcg by mouth daily before breakfast.    [provider]  lisinopril (ZESTRIL) 10 MG tablet Take 10 mg by mouth daily.    [provider]  omeprazole (PRILOSEC) 40 MG capsule Take 40 mg by mouth 2 (two) times daily. 08/21/20   [provider]      Allergies    Codeine, Demerol [meperidine hcl], Penicillins, and  Propoxyphene    Review of Systems   Review of Systems  Physical Exam Updated Vital Signs BP (!) 190/65   Pulse 67   Temp 97.9 F (36.6 C) (Oral)   Resp 17   SpO2 96%  Physical Exam Vitals and nursing note reviewed.  Constitutional:      General: She is not in acute distress.    Appearance: She is well-developed.  HENT:     Head: Normocephalic. Contusion present.      Mouth/Throat:     Mouth: Mucous membranes are moist.  Eyes:     General: Vision grossly intact. Gaze aligned appropriately.     Extraocular Movements: Extraocular movements intact.     Conjunctiva/sclera: Conjunctivae normal.  Cardiovascular:     Rate and Rhythm: Normal rate and regular rhythm.     Pulses: Normal pulses.     Heart sounds: Normal heart sounds, S1 normal and S2 normal. No murmur heard.    No friction rub. No gallop.  Pulmonary:     Effort: Pulmonary effort is normal. No respiratory distress.     Breath sounds: Normal breath sounds.  Abdominal:     General: Bowel sounds are normal.     Palpations: Abdomen is soft.     Tenderness: There is no abdominal tenderness. There is no guarding or rebound.     Hernia: No hernia is present.  Musculoskeletal:        General: No  swelling.     Cervical back: Full passive range of motion without pain, normal range of motion and neck supple. No spinous process tenderness or muscular tenderness. Normal range of motion.     Right hip: Normal.     Left hip: Normal.     Left knee: No swelling or deformity. Tenderness present.     Right lower leg: No edema.     Left lower leg: No edema.  Skin:    General: Skin is warm and dry.     Capillary Refill: Capillary refill takes less than 2 seconds.     Findings: No ecchymosis, erythema, rash or wound.  Neurological:     General: No focal deficit present.     Mental Status: She is alert and oriented to person, place, and time.     GCS: GCS eye subscore is 4. GCS verbal subscore is 5. GCS motor subscore is 6.      Cranial Nerves: Cranial nerves 2-12 are intact.     Sensory: Sensation is intact.     Motor: Motor function is intact.     Coordination: Coordination is intact.  Psychiatric:        Attention and Perception: Attention normal.        Mood and Affect: Mood normal.        Speech: Speech normal.        Behavior: Behavior normal.     ED Results / Procedures / Treatments   Labs (all labs ordered are listed, but only abnormal results are displayed) Labs Reviewed  CBC WITH DIFFERENTIAL/PLATELET - Abnormal; Notable for the following components:      Result Value   Hemoglobin 11.9 (*)    All other components within normal limits  COMPREHENSIVE METABOLIC PANEL - Abnormal; Notable for the following components:   Glucose, Bld 112 (*)    All other components within normal limits  CBG MONITORING, ED - Abnormal; Notable for the following components:   Glucose-Capillary 104 (*)    All other components within normal limits  MAGNESIUM    EKG EKG Interpretation Date/Time:  Monday February 16 2023 22:52:44 EST Ventricular Rate:  74 PR Interval:  160 QRS Duration:  78 QT Interval:  386 QTC Calculation: 429 R Axis:   21  Text Interpretation: Sinus rhythm Abnormal R-wave progression, early transition Confirmed by Gilda Crease [phone]) on 02/16/2023 11:02:05 PM  Radiology CT Head Wo Contrast Result Date: 02/16/2023 CLINICAL DATA:  Head trauma, minor (Age >= 65y).  Fall EXAM: CT HEAD WITHOUT CONTRAST TECHNIQUE: Contiguous axial images were obtained from the base of the skull through the vertex without intravenous contrast. RADIATION DOSE REDUCTION: This exam was performed according to the departmental dose-optimization program which includes automated exposure control, adjustment of the mA and/or kV according to patient size and/or use of iterative reconstruction technique. COMPARISON:  11/23/2020 FINDINGS: Brain: Mild age related volume loss. No acute intracranial abnormality. Specifically,  no hemorrhage, hydrocephalus, mass lesion, acute infarction, or significant intracranial injury. Vascular: No hyperdense vessel or unexpected calcification. Skull: No acute calvarial abnormality. Sinuses/Orbits: No acute findings Other: Soft tissue swelling in the left forehead. IMPRESSION: No acute intracranial abnormality. Electronically Signed   By: Charlett Nose M.D.   On: 02/16/2023 23:24   CT Cervical Spine Wo Contrast Result Date: 02/16/2023 CLINICAL DATA:  Neck trauma (Age >= 65y).  Fall EXAM: CT CERVICAL SPINE WITHOUT CONTRAST TECHNIQUE: Multidetector CT imaging of the cervical spine was performed without intravenous contrast. Multiplanar CT image reconstructions  were also generated. RADIATION DOSE REDUCTION: This exam was performed according to the departmental dose-optimization program which includes automated exposure control, adjustment of the mA and/or kV according to patient size and/or use of iterative reconstruction technique. COMPARISON:  None Available. FINDINGS: Alignment: Normal Skull base and vertebrae: No acute fracture. No primary bone lesion or focal pathologic process. Soft tissues and spinal canal: No prevertebral fluid or swelling. No visible canal hematoma. Disc levels: Anterior fusion from C3-C6. Mild degenerative disc and facet disease above and below the fusion level. Upper chest: No acute findings Other: None IMPRESSION: Postoperative and degenerative changes in the cervical spine. No acute bony abnormality. Electronically Signed   By: Charlett Nose M.D.   On: 02/16/2023 23:23   DG Knee Complete 4 Views Left Result Date: 02/16/2023 CLINICAL DATA:  Larey Seat, left knee pain EXAM: LEFT KNEE - COMPLETE 4+ VIEW COMPARISON:  10/06/2017 FINDINGS: Frontal, bilateral oblique, and lateral views of the left knee are obtained. No fracture, subluxation, or dislocation. Stable 3 compartmental osteoarthritis greatest in the lateral compartment. No joint effusion. Soft tissues are unremarkable.  IMPRESSION: 1. No acute displaced fracture. 2. Three compartmental osteoarthritis. Electronically Signed   By: Sharlet Salina M.D.   On: 02/16/2023 23:14    Procedures Procedures    Medications Ordered in ED Medications  sodium chloride 0.9 % bolus 1,000 mL (1,000 mLs Intravenous New Bag/Given 02/16/23 2300)    ED Course/ Medical Decision Making/ A&P                                 Medical Decision Making  Presents for evaluation after a fall.  Patient with contusion to forehead and left knee injury from the fall.  Patient does not take blood thinners, no neurologic deficit.  Patient underwent CT head and cervical spine, both negative.  Examination of left knee reveals tenderness without swelling, deformity, range of motion is preserved.  No evidence of hip injury bilaterally.  X-ray of left knee does not show acute fracture or dislocation.  Patient reports that she has been feeling ill recently.  She developed GI symptoms since then she had diverticulosis noted on colonoscopy in November of last year, her doctor started Cipro and Flagyl for empiric treatment.  She has developed significant diarrhea with this treatment.  She was tested for C. difficile and it was negative 3 days ago.  Patient given IV fluids here.  Labs look favorable, no acute kidney injury, normal electrolytes.  Patient without significant leukocytosis, no significant anemia.  Will provide Lomotil for as needed use.  Follow-up with her GI doctor.         Final Clinical Impression(s) / ED Diagnoses Final diagnoses:  Contusion of face, initial encounter  Contusion of left knee, initial encounter  Diarrhea, unspecified type    Rx / DC Orders ED Discharge Orders          Ordered    diphenoxylate-atropine (LOMOTIL) 2.5-0.025 MG tablet  4 times daily PRN        02/17/23 0002              Gilda Crease, MD 02/17/23 0003

## 2023-02-17 DIAGNOSIS — S0083XA Contusion of other part of head, initial encounter: Secondary | ICD-10-CM | POA: Diagnosis not present

## 2023-02-17 MED ORDER — DIPHENOXYLATE-ATROPINE 2.5-0.025 MG PO TABS: 1.0000 | ORAL_TABLET | Freq: Four times a day (QID) | ORAL | 0 refills | Status: AC | PRN

## 2023-03-15 ENCOUNTER — Emergency Department (HOSPITAL_COMMUNITY)
Admission: EM | Admit: 2023-03-15 | Discharge: 2023-03-15 | Disposition: A | Discharge status: Home / Self Care | Payer: 59 | Attending: Emergency Medicine | Admitting: Emergency Medicine

## 2023-03-15 ENCOUNTER — Emergency Department (HOSPITAL_COMMUNITY): Payer: 59

## 2023-03-15 ENCOUNTER — Encounter (HOSPITAL_COMMUNITY): Payer: Self-pay

## 2023-03-15 ENCOUNTER — Other Ambulatory Visit: Payer: Self-pay

## 2023-03-15 DIAGNOSIS — Z7982 Long term (current) use of aspirin: Secondary | ICD-10-CM | POA: Insufficient documentation

## 2023-03-15 DIAGNOSIS — M79601 Pain in right arm: Secondary | ICD-10-CM | POA: Insufficient documentation

## 2023-03-15 DIAGNOSIS — Z79899 Other long term (current) drug therapy: Secondary | ICD-10-CM | POA: Insufficient documentation

## 2023-03-15 DIAGNOSIS — S42321A Displaced transverse fracture of shaft of humerus, right arm, initial encounter for closed fracture: Secondary | ICD-10-CM | POA: Diagnosis not present

## 2023-03-15 DIAGNOSIS — W1839XA Other fall on same level, initial encounter: Secondary | ICD-10-CM | POA: Insufficient documentation

## 2023-03-15 DIAGNOSIS — I10 Essential (primary) hypertension: Secondary | ICD-10-CM | POA: Diagnosis not present

## 2023-03-15 DIAGNOSIS — S4991XA Unspecified injury of right shoulder and upper arm, initial encounter: Secondary | ICD-10-CM | POA: Diagnosis present

## 2023-03-15 DIAGNOSIS — S0990XA Unspecified injury of head, initial encounter: Secondary | ICD-10-CM | POA: Diagnosis present

## 2023-03-15 LAB — BASIC METABOLIC PANEL
Anion gap: 9 (ref 5–15)
BUN: 22 mg/dL (ref 8–23)
CO2: 24 mmol/L (ref 22–32)
Calcium: 9.5 mg/dL (ref 8.9–10.3)
Chloride: 102 mmol/L (ref 98–111)
Creatinine, Ser: 0.81 mg/dL (ref 0.44–1.00)
GFR, Estimated: >60 mL/min (ref >60)
Glucose, Bld: 137 mg/dL — ABNORMAL HIGH (ref 70–99)
Potassium: 3.7 mmol/L (ref 3.5–5.1)
Sodium: 135 mmol/L (ref 135–145)

## 2023-03-15 LAB — CBC WITH DIFFERENTIAL/PLATELET
Abs Immature Granulocytes: 0.09 10*3/uL — ABNORMAL HIGH (ref 0.00–0.07)
Basophils Absolute: 0.1 10*3/uL (ref 0.0–0.1)
Basophils Relative: 0 %
Eosinophils Absolute: 0 10*3/uL (ref 0.0–0.5)
Eosinophils Relative: 0 %
HCT: 38.3 % (ref 36.0–46.0)
Hemoglobin: 12.4 g/dL (ref 12.0–15.0)
Immature Granulocytes: 1 %
Lymphocytes Relative: 5 %
Lymphs Abs: 0.9 10*3/uL (ref 0.7–4.0)
MCH: 29.3 pg (ref 26.0–34.0)
MCHC: 32.4 g/dL (ref 30.0–36.0)
MCV: 90.5 fL (ref 80.0–100.0)
Monocytes Absolute: 0.7 10*3/uL (ref 0.1–1.0)
Monocytes Relative: 4 %
Neutro Abs: 14.8 10*3/uL — ABNORMAL HIGH (ref 1.7–7.7)
Neutrophils Relative %: 90 %
Platelets: 274 10*3/uL (ref 150–400)
RBC: 4.23 MIL/uL (ref 3.87–5.11)
RDW: 13.2 % (ref 11.5–15.5)
WBC: 16.5 10*3/uL — ABNORMAL HIGH (ref 4.0–10.5)
nRBC: 0 % (ref 0.0–0.2)

## 2023-03-15 MED ORDER — OXYCODONE HCL 5 MG PO TABS
5.0000 mg | ORAL_TABLET | Freq: Once | ORAL | Status: AC
  Administered 2023-03-15: 5 mg via ORAL
  Filled 2023-03-15: qty 1

## 2023-03-15 MED ORDER — OXYCODONE-ACETAMINOPHEN 5-325 MG PO TABS: 1.0000 | ORAL_TABLET | Freq: Four times a day (QID) | ORAL | 0 refills | Status: DC | PRN

## 2023-03-15 MED ORDER — FENTANYL CITRATE PF 50 MCG/ML IJ SOSY
50.0000 ug | PREFILLED_SYRINGE | Freq: Once | INTRAMUSCULAR | Status: AC
  Administered 2023-03-15: 50 ug via INTRAVENOUS
  Filled 2023-03-15: qty 1

## 2023-03-15 MED ORDER — ACETAMINOPHEN 500 MG PO TABS
1000.0000 mg | ORAL_TABLET | Freq: Once | ORAL | Status: AC
  Administered 2023-03-15: 1000 mg via ORAL
  Filled 2023-03-15: qty 2

## 2023-03-15 MED ORDER — OXYCODONE HCL 5 MG PO TABS: 5.0000 mg | ORAL_TABLET | Freq: Four times a day (QID) | ORAL | 0 refills | Status: DC | PRN

## 2023-03-15 MED ORDER — KETOROLAC TROMETHAMINE 15 MG/ML IJ SOLN
15.0000 mg | Freq: Once | INTRAMUSCULAR | Status: AC
  Administered 2023-03-15: 15 mg via INTRAVENOUS
  Filled 2023-03-15: qty 1
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
## 2023-03-15 NOTE — Discharge Instructions (Addendum)
You were seen in the ER today for a fracture of your upper arm that is displaced.  I spoke with Dr. Constance Goltz the on-call orthopedic surgeon.  He recommend immobilization with a sling and the splint to provide some weight for traction.  You can either follow-up with orthopedics here in [place] or with Dr. Orlan Leavens or Aundria Rud.  I have lifted all of these practices on your follow-up.  Call tomorrow morning to establish care.  You were prescribed opiate pain medication. This can have many sided effect such as drowsiness, slowed breathing, and conspitation. Do not take it with other medications that cause drowsiness, or drink alcohol while taking it. Take a stool softener over the counter while taking it.  Also take over-the-counter ibuprofen as directed on the packaging and the over-the-counter Tylenol.    Back to the ER for new or worsening symptoms.  It is important to have somebody help you at home especially until you see the orthopedic doctor.

## 2023-03-17 MED FILL — Oxycodone w/ Acetaminophen Tab 5-325 MG: ORAL | Qty: 6 | Status: AC

## 2023-03-18 ENCOUNTER — Encounter (HOSPITAL_COMMUNITY): Payer: Self-pay | Admitting: Orthopedic Surgery

## 2023-03-19 ENCOUNTER — Other Ambulatory Visit: Payer: Self-pay

## 2023-03-19 ENCOUNTER — Encounter (HOSPITAL_COMMUNITY): Payer: Self-pay | Admitting: Orthopedic Surgery

## 2023-03-19 NOTE — Progress Notes (Signed)
SDW CALL  Patient was given pre-op instructions over the phone. The opportunity was given for the patient to ask questions. No further questions asked. Patient verbalized understanding of instructions given.   PCP - Joesphine Bare Cardiologist - denies  PPM/ICD - denies Device Orders -  Rep Notified -   Chest x-ray - na EKG - 02/17/23 Stress Test - denies ECHO - denies Cardiac Cath - denies  Sleep Study - denies CPAP -   Fasting Blood Sugar - na Checks Blood Sugar _____ times a day  Blood Thinner Instructions:na Aspirin Instructions:pt stopped in January  ERAS Protcol -clears until 1045 PRE-SURGERY Ensure or G2- no  COVID TEST- na   Anesthesia review: no  Patient denies shortness of breath, fever, cough and chest pain over the phone call    Surgical Instructions    Your procedure is scheduled on February 21  Report to [facility] Main Entrance "A" at 11:15 A.M., then check in with the Admitting office.  Call this number if you have problems the morning of surgery:  [phone]    Remember:  Do not eat after midnight the night before your surgery  You may drink clear liquids until 1045 the morning of your surgery.   Clear liquids allowed are: Water, Non-Citrus Juices (without pulp), Carbonated Beverages, Clear Tea, Black Coffee ONLY (NO MILK, CREAM OR POWDERED CREAMER of any kind), and Gatorade   Take these medicines the morning of surgery with A SIP OF WATER: Cymbalta,Synthroid,Prilosec. PRN-Xanax,Percocet    As of today, STOP taking any Aspirin (unless otherwise instructed by your surgeon) Aleve, Naproxen, Ibuprofen, Motrin, Advil, Goody's, BC's, all herbal medications, fish oil, and all vitamins. Hold Diclofenac(Voltaren)gel.  [facility] is not responsible for any belongings or valuables. .   Do NOT Smoke (Tobacco/Vaping)  24 hours prior to your procedure  If you use a CPAP at night, you may bring your mask for your overnight stay.   Contacts,  glasses, hearing aids, dentures or partials may not be worn into surgery, please bring cases for these belongings   Patients discharged the day of surgery will not be allowed to drive home, and someone needs to stay with them for 24 hours.   Special instructions:    Oral Hygiene is also important to reduce your risk of infection.  Remember - BRUSH YOUR TEETH THE MORNING OF SURGERY WITH YOUR REGULAR TOOTHPASTE   Day of Surgery:  Take a shower the day of or night before with antibacterial soap. Wear Clean/Comfortable clothing the morning of surgery Do not apply any deodorants/lotions.   Do not wear jewelry or makeup Do not wear lotions, powders, perfumes/colognes, or deodorant. Do not shave 48 hours prior to surgery.  Men may shave face and neck. Do not bring valuables to the hospital. Do not wear nail polish, gel polish, artificial nails, or any other type of covering on natural nails (fingers and toes) If you have artificial nails or gel coating that need to be removed by a nail salon, please have this removed prior to surgery. Artificial nails or gel coating may interfere with anesthesia's ability to adequately monitor your vital signs. Remember to brush your teeth WITH YOUR REGULAR TOOTHPASTE.   At pt's request, reviewed arrival time,ERAS times, and meds with her daughter,Lisa Nada Libman. Also gave Ms. Nada Libman short stay phone number.

## 2023-03-19 NOTE — Anesthesia Preprocedure Evaluation (Signed)
Anesthesia Evaluation  Patient identified by MRN, date of birth, ID band Patient awake    Reviewed: Allergy & Precautions, NPO status , Patient's Chart, lab work & pertinent test results  Airway Mallampati: II  TM Distance: >3 FB Neck ROM: Full    Dental no notable dental hx. (+) Teeth Intact, Caps, Dental Advisory Given   Pulmonary    Pulmonary exam normal breath sounds clear to auscultation       Cardiovascular hypertension (lisinopril), Pt. on medications Normal cardiovascular exam Rhythm:Regular Rate:Normal     Neuro/Psych  PSYCHIATRIC DISORDERS Anxiety Depression    Vertigo     GI/Hepatic Neg liver ROS, hiatal hernia (s/p repair),GERD  ,,  Endo/Other  Hypothyroidism    Renal/GU negative Renal ROS Bladder dysfunction      Musculoskeletal  (+) Arthritis , Osteoarthritis,  S/P C-Spine surgery   Abdominal   Peds  Hematology  (+) Blood dyscrasia, anemia Lab Results      Component                Value               Date                      WBC                      16.5 (H)            03/15/2023                HGB                      12.4                03/15/2023                HCT                      38.3                03/15/2023                MCV                      90.5                03/15/2023                PLT                      274                 03/15/2023              Anesthesia Other Findings   Reproductive/Obstetrics                             Anesthesia Physical Anesthesia Plan  ASA: 2  Anesthesia Plan: General   Post-op Pain Management: Regional block* and Ofirmev IV (intra-op)*   Induction: Intravenous  PONV Risk Score and Plan: 3 and Ondansetron, Dexamethasone and Treatment may vary due to age or medical condition  Airway Management Planned: Oral ETT and Video Laryngoscope Planned  Additional Equipment: None  Intra-op Plan:   Post-operative Plan:  Extubation in OR  Informed Consent: I have reviewed the patients History and  Physical, chart, labs and discussed the procedure including the risks, benefits and alternatives for the proposed anesthesia with the patient or authorized representative who has indicated his/her understanding and acceptance.     Dental advisory given  Plan Discussed with: CRNA and Anesthesiologist  Anesthesia Plan Comments: (Discussed potential risks of nerve blocks including, but not limited to, infection, bleeding, nerve damage, seizures, pneumothorax, respiratory depression, and potential failure of the block. Alternatives to nerve blocks discussed. All questions answered.  Risks of general anesthesia discussed including, but not limited to, sore throat, hoarse voice, chipped/damaged teeth, injury to vocal cords, nausea and vomiting, allergic reactions, lung infection, heart attack, stroke, and death. All questions answered. )       Anesthesia Quick Evaluation

## 2023-03-20 ENCOUNTER — Ambulatory Visit (HOSPITAL_COMMUNITY): Payer: 59 | Admitting: Anesthesiology

## 2023-03-20 ENCOUNTER — Ambulatory Visit (HOSPITAL_COMMUNITY): Payer: 59

## 2023-03-20 ENCOUNTER — Observation Stay (HOSPITAL_COMMUNITY)
Admission: RE | Admit: 2023-03-20 | Discharge: 2023-03-21 | Disposition: A | Discharge status: Home / Self Care | Payer: 59 | Attending: Orthopedic Surgery | Admitting: Orthopedic Surgery

## 2023-03-20 ENCOUNTER — Encounter (HOSPITAL_COMMUNITY)
Admission: RE | Disposition: A | Discharge status: Home / Self Care | Payer: Self-pay | Source: Home / Self Care | Attending: Orthopedic Surgery

## 2023-03-20 ENCOUNTER — Other Ambulatory Visit: Payer: Self-pay

## 2023-03-20 ENCOUNTER — Ambulatory Visit (HOSPITAL_BASED_OUTPATIENT_CLINIC_OR_DEPARTMENT_OTHER): Payer: 59 | Admitting: Anesthesiology

## 2023-03-20 DIAGNOSIS — R2681 Unsteadiness on feet: Secondary | ICD-10-CM | POA: Insufficient documentation

## 2023-03-20 DIAGNOSIS — S42201A Unspecified fracture of upper end of right humerus, initial encounter for closed fracture: Secondary | ICD-10-CM | POA: Diagnosis not present

## 2023-03-20 DIAGNOSIS — Z79899 Other long term (current) drug therapy: Secondary | ICD-10-CM | POA: Insufficient documentation

## 2023-03-20 DIAGNOSIS — I10 Essential (primary) hypertension: Secondary | ICD-10-CM | POA: Diagnosis not present

## 2023-03-20 DIAGNOSIS — S42321A Displaced transverse fracture of shaft of humerus, right arm, initial encounter for closed fracture: Principal | ICD-10-CM | POA: Diagnosis present

## 2023-03-20 DIAGNOSIS — Z7982 Long term (current) use of aspirin: Secondary | ICD-10-CM | POA: Insufficient documentation

## 2023-03-20 DIAGNOSIS — W19XXXA Unspecified fall, initial encounter: Secondary | ICD-10-CM | POA: Insufficient documentation

## 2023-03-20 DIAGNOSIS — E039 Hypothyroidism, unspecified: Secondary | ICD-10-CM | POA: Insufficient documentation

## 2023-03-20 HISTORY — DX: Anxiety disorder, unspecified: F41.9

## 2023-03-20 HISTORY — PX: ORIF HUMERUS FRACTURE: SHX2126

## 2023-03-20 HISTORY — DX: Gastro-esophageal reflux disease without esophagitis: K21.9

## 2023-03-20 HISTORY — DX: Hypothyroidism, unspecified: E03.9

## 2023-03-20 LAB — CBC
HCT: 32.6 % — ABNORMAL LOW (ref 36.0–46.0)
Hemoglobin: 10.4 g/dL — ABNORMAL LOW (ref 12.0–15.0)
MCH: 28.3 pg (ref 26.0–34.0)
MCHC: 31.9 g/dL (ref 30.0–36.0)
MCV: 88.6 fL (ref 80.0–100.0)
Platelets: 247 10*3/uL (ref 150–400)
RBC: 3.68 MIL/uL — ABNORMAL LOW (ref 3.87–5.11)
RDW: 13.2 % (ref 11.5–15.5)
WBC: 8.8 10*3/uL (ref 4.0–10.5)
nRBC: 0 % (ref 0.0–0.2)

## 2023-03-20 LAB — CREATININE, SERUM
Creatinine, Ser: 0.86 mg/dL (ref 0.44–1.00)
GFR, Estimated: >60 mL/min (ref >60)

## 2023-03-20 SURGERY — OPEN REDUCTION INTERNAL FIXATION (ORIF) PROXIMAL HUMERUS FRACTURE
Anesthesia: General | Site: Arm Upper | Laterality: Right

## 2023-03-20 MED ORDER — ACETAMINOPHEN 325 MG PO TABS: 325.0000 mg | ORAL_TABLET | Freq: Four times a day (QID) | ORAL | Status: DC | PRN

## 2023-03-20 MED ORDER — ROCURONIUM BROMIDE 10 MG/ML (PF) SYRINGE
PREFILLED_SYRINGE | INTRAVENOUS | Status: DC | PRN
  Administered 2023-03-20: 60 mg via INTRAVENOUS

## 2023-03-20 MED ORDER — FENTANYL CITRATE (PF) 250 MCG/5ML IJ SOLN
INTRAMUSCULAR | Status: AC
  Filled 2023-03-20: qty 5

## 2023-03-20 MED ORDER — ONDANSETRON HCL 4 MG PO TABS: 4.0000 mg | ORAL_TABLET | Freq: Four times a day (QID) | ORAL | Status: DC | PRN

## 2023-03-20 MED ORDER — DULOXETINE HCL 20 MG PO CPEP
20.0000 mg | ORAL_CAPSULE | Freq: Two times a day (BID) | ORAL | Status: DC
Start: 2023-03-20 — End: 2023-03-21
  Administered 2023-03-20 – 2023-03-21 (×2): 20 mg via ORAL
  Filled 2023-03-20 (×2): qty 1

## 2023-03-20 MED ORDER — ROCURONIUM BROMIDE 10 MG/ML (PF) SYRINGE
PREFILLED_SYRINGE | INTRAVENOUS | Status: AC
  Filled 2023-03-20: qty 10

## 2023-03-20 MED ORDER — ONDANSETRON HCL 4 MG/2ML IJ SOLN
INTRAMUSCULAR | Status: AC
  Filled 2023-03-20: qty 2

## 2023-03-20 MED ORDER — ORAL CARE MOUTH RINSE: 15.0000 mL | Freq: Once | OROMUCOSAL | Status: AC

## 2023-03-20 MED ORDER — HYDROCODONE-ACETAMINOPHEN 7.5-325 MG PO TABS: 1.0000 | ORAL_TABLET | ORAL | Status: DC | PRN

## 2023-03-20 MED ORDER — METOCLOPRAMIDE HCL 5 MG PO TABS: 5.0000 mg | ORAL_TABLET | Freq: Three times a day (TID) | ORAL | Status: DC | PRN

## 2023-03-20 MED ORDER — CHLORHEXIDINE GLUCONATE 0.12 % MT SOLN
15.0000 mL | Freq: Once | OROMUCOSAL | Status: AC
  Administered 2023-03-20: 15 mL via OROMUCOSAL
  Filled 2023-03-20: qty 15

## 2023-03-20 MED ORDER — ALPRAZOLAM 0.25 MG PO TABS: 0.2500 mg | ORAL_TABLET | Freq: Every day | ORAL | Status: DC | PRN

## 2023-03-20 MED ORDER — PROPOFOL 10 MG/ML IV BOLUS
INTRAVENOUS | Status: DC | PRN
  Administered 2023-03-20: 100 mg via INTRAVENOUS

## 2023-03-20 MED ORDER — ONDANSETRON HCL 4 MG/2ML IJ SOLN: 4.0000 mg | Freq: Four times a day (QID) | INTRAMUSCULAR | Status: DC | PRN

## 2023-03-20 MED ORDER — ENOXAPARIN SODIUM 40 MG/0.4ML IJ SOSY
40.0000 mg | PREFILLED_SYRINGE | INTRAMUSCULAR | Status: DC
  Administered 2023-03-21: 40 mg via SUBCUTANEOUS
  Filled 2023-03-20: qty 0.4

## 2023-03-20 MED ORDER — METHOCARBAMOL 500 MG PO TABS: 500.0000 mg | ORAL_TABLET | Freq: Four times a day (QID) | ORAL | Status: DC | PRN

## 2023-03-20 MED ORDER — PHENYLEPHRINE 80 MCG/ML (10ML) SYRINGE FOR IV PUSH (FOR BLOOD PRESSURE SUPPORT)
PREFILLED_SYRINGE | INTRAVENOUS | Status: AC
  Filled 2023-03-20: qty 10

## 2023-03-20 MED ORDER — SUGAMMADEX SODIUM 200 MG/2ML IV SOLN
INTRAVENOUS | Status: AC
  Filled 2023-03-20: qty 2

## 2023-03-20 MED ORDER — FENTANYL CITRATE (PF) 250 MCG/5ML IJ SOLN
INTRAMUSCULAR | Status: DC | PRN
  Administered 2023-03-20: 50 ug via INTRAVENOUS
  Administered 2023-03-20: 100 ug via INTRAVENOUS

## 2023-03-20 MED ORDER — OXYCODONE HCL 5 MG PO TABS
5.0000 mg | ORAL_TABLET | Freq: Four times a day (QID) | ORAL | 0 refills | Status: AC | PRN
Start: 2023-03-20 — End: ?

## 2023-03-20 MED ORDER — ONDANSETRON 4 MG PO TBDP: 4.0000 mg | ORAL_TABLET | Freq: Three times a day (TID) | ORAL | 0 refills | Status: AC | PRN

## 2023-03-20 MED ORDER — PANTOPRAZOLE SODIUM 40 MG PO TBEC
40.0000 mg | DELAYED_RELEASE_TABLET | Freq: Every day | ORAL | Status: DC
  Administered 2023-03-21: 40 mg via ORAL
  Filled 2023-03-20: qty 1

## 2023-03-20 MED ORDER — HYDROCODONE-ACETAMINOPHEN 5-325 MG PO TABS: 1.0000 | ORAL_TABLET | ORAL | Status: DC | PRN

## 2023-03-20 MED ORDER — VANCOMYCIN HCL IN DEXTROSE 1-5 GM/200ML-% IV SOLN
1000.0000 mg | Freq: Two times a day (BID) | INTRAVENOUS | Status: AC
  Administered 2023-03-21: 1000 mg via INTRAVENOUS
  Filled 2023-03-20: qty 200

## 2023-03-20 MED ORDER — LABETALOL HCL 5 MG/ML IV SOLN
INTRAVENOUS | Status: DC | PRN
  Administered 2023-03-20: 5 mg via INTRAVENOUS

## 2023-03-20 MED ORDER — PHENYLEPHRINE 80 MCG/ML (10ML) SYRINGE FOR IV PUSH (FOR BLOOD PRESSURE SUPPORT)
PREFILLED_SYRINGE | INTRAVENOUS | Status: DC | PRN
Start: 2023-03-20 — End: 2023-03-20
  Administered 2023-03-20: 120 ug via INTRAVENOUS

## 2023-03-20 MED ORDER — MORPHINE SULFATE (PF) 2 MG/ML IV SOLN: 0.5000 mg | INTRAVENOUS | Status: DC | PRN

## 2023-03-20 MED ORDER — ACETAMINOPHEN 500 MG PO TABS
1000.0000 mg | ORAL_TABLET | Freq: Once | ORAL | Status: AC
  Administered 2023-03-20: 500 mg via ORAL
  Filled 2023-03-20: qty 2

## 2023-03-20 MED ORDER — DOCUSATE SODIUM 100 MG PO CAPS
100.0000 mg | ORAL_CAPSULE | Freq: Two times a day (BID) | ORAL | Status: DC
  Filled 2023-03-20 (×2): qty 1

## 2023-03-20 MED ORDER — ACETAMINOPHEN 500 MG PO TABS
500.0000 mg | ORAL_TABLET | Freq: Four times a day (QID) | ORAL | Status: DC
  Administered 2023-03-20 – 2023-03-21 (×3): 500 mg via ORAL
  Filled 2023-03-20 (×3): qty 1

## 2023-03-20 MED ORDER — BUPIVACAINE LIPOSOME 1.3 % IJ SUSP
INTRAMUSCULAR | Status: DC | PRN
  Administered 2023-03-20: 10 mL via PERINEURAL

## 2023-03-20 MED ORDER — TRANEXAMIC ACID-NACL 1000-0.7 MG/100ML-% IV SOLN
1000.0000 mg | INTRAVENOUS | Status: AC
  Administered 2023-03-20: 1000 mg via INTRAVENOUS
  Filled 2023-03-20: qty 100

## 2023-03-20 MED ORDER — METOCLOPRAMIDE HCL 5 MG/ML IJ SOLN: 5.0000 mg | Freq: Three times a day (TID) | INTRAMUSCULAR | Status: DC | PRN

## 2023-03-20 MED ORDER — OXYCODONE HCL 5 MG/5ML PO SOLN: 5.0000 mg | Freq: Once | ORAL | Status: DC | PRN

## 2023-03-20 MED ORDER — MENTHOL 3 MG MT LOZG
1.0000 | LOZENGE | OROMUCOSAL | Status: DC | PRN
  Administered 2023-03-20: 3 mg via ORAL
  Filled 2023-03-20: qty 9

## 2023-03-20 MED ORDER — FENTANYL CITRATE (PF) 100 MCG/2ML IJ SOLN
INTRAMUSCULAR | Status: AC
  Administered 2023-03-20: 50 ug via INTRAVENOUS
  Filled 2023-03-20: qty 2

## 2023-03-20 MED ORDER — LACTATED RINGERS IV SOLN: INTRAVENOUS | Status: DC

## 2023-03-20 MED ORDER — LISINOPRIL 10 MG PO TABS
10.0000 mg | ORAL_TABLET | Freq: Every day | ORAL | Status: DC
  Administered 2023-03-20 – 2023-03-21 (×2): 10 mg via ORAL
  Filled 2023-03-20 (×2): qty 1

## 2023-03-20 MED ORDER — METHOCARBAMOL 1000 MG/10ML IJ SOLN: 500.0000 mg | Freq: Four times a day (QID) | INTRAMUSCULAR | Status: DC | PRN

## 2023-03-20 MED ORDER — LIDOCAINE 2% (20 MG/ML) 5 ML SYRINGE
INTRAMUSCULAR | Status: AC
  Filled 2023-03-20: qty 5

## 2023-03-20 MED ORDER — DEXAMETHASONE SODIUM PHOSPHATE 10 MG/ML IJ SOLN
INTRAMUSCULAR | Status: DC | PRN
  Administered 2023-03-20: 10 mg via INTRAVENOUS

## 2023-03-20 MED ORDER — FENTANYL CITRATE (PF) 100 MCG/2ML IJ SOLN: 25.0000 ug | INTRAMUSCULAR | Status: DC | PRN

## 2023-03-20 MED ORDER — ONDANSETRON HCL 4 MG/2ML IJ SOLN
INTRAMUSCULAR | Status: DC | PRN
  Administered 2023-03-20: 4 mg via INTRAVENOUS

## 2023-03-20 MED ORDER — LEVOTHYROXINE SODIUM 75 MCG PO TABS
75.0000 ug | ORAL_TABLET | Freq: Every day | ORAL | Status: DC
  Administered 2023-03-21: 75 ug via ORAL
  Filled 2023-03-20: qty 1

## 2023-03-20 MED ORDER — BUPIVACAINE HCL (PF) 0.5 % IJ SOLN
INTRAMUSCULAR | Status: DC | PRN
  Administered 2023-03-20: 15 mL via PERINEURAL

## 2023-03-20 MED ORDER — OXYCODONE HCL 5 MG PO TABS: 5.0000 mg | ORAL_TABLET | Freq: Once | ORAL | Status: DC | PRN

## 2023-03-20 MED ORDER — TRANEXAMIC ACID-NACL 1000-0.7 MG/100ML-% IV SOLN
1000.0000 mg | Freq: Once | INTRAVENOUS | Status: AC
  Administered 2023-03-20: 1000 mg via INTRAVENOUS
  Filled 2023-03-20: qty 100

## 2023-03-20 MED ORDER — AMISULPRIDE (ANTIEMETIC) 5 MG/2ML IV SOLN: 10.0000 mg | Freq: Once | INTRAVENOUS | Status: DC | PRN

## 2023-03-20 MED ORDER — PROPOFOL 10 MG/ML IV BOLUS
INTRAVENOUS | Status: AC
  Filled 2023-03-20: qty 20

## 2023-03-20 MED ORDER — PHENOL 1.4 % MT LIQD: 1.0000 | OROMUCOSAL | Status: DC | PRN

## 2023-03-20 MED ORDER — 0.9 % SODIUM CHLORIDE (POUR BTL) OPTIME
TOPICAL | Status: DC | PRN
Start: 2023-03-20 — End: 2023-03-20
  Administered 2023-03-20: 1000 mL

## 2023-03-20 MED ORDER — ASPIRIN 81 MG PO TBEC
81.0000 mg | DELAYED_RELEASE_TABLET | Freq: Every day | ORAL | Status: DC
  Administered 2023-03-20 – 2023-03-21 (×2): 81 mg via ORAL
  Filled 2023-03-20 (×2): qty 1

## 2023-03-20 MED ORDER — FENTANYL CITRATE (PF) 100 MCG/2ML IJ SOLN: 50.0000 ug | Freq: Once | INTRAMUSCULAR | Status: AC

## 2023-03-20 MED ORDER — LIDOCAINE 2% (20 MG/ML) 5 ML SYRINGE
INTRAMUSCULAR | Status: DC | PRN
  Administered 2023-03-20: 80 mg via INTRAVENOUS

## 2023-03-20 MED ORDER — VANCOMYCIN HCL IN DEXTROSE 1-5 GM/200ML-% IV SOLN
1000.0000 mg | INTRAVENOUS | Status: AC
  Administered 2023-03-20: 1000 mg via INTRAVENOUS
  Filled 2023-03-20: qty 200

## 2023-03-20 MED ORDER — DEXAMETHASONE SODIUM PHOSPHATE 10 MG/ML IJ SOLN
INTRAMUSCULAR | Status: AC
Start: 2023-03-20 — End: ?
  Filled 2023-03-20: qty 1

## 2023-03-20 SURGICAL SUPPLY — 51 items
BAG COUNTER SPONGE SURGICOUNT (BAG) ×1 IMPLANT
BIT DRILL 10 HOLLOW (BIT) IMPLANT
BIT DRILL 3.8X270 (BIT) IMPLANT
BIT DRILL SHORT 3.2MM (DRILL) IMPLANT
CLSR STERI-STRIP ANTIMIC 1/2X4 (GAUZE/BANDAGES/DRESSINGS) IMPLANT
COVER SURGICAL LIGHT HANDLE (MISCELLANEOUS) ×1 IMPLANT
DRAPE INCISE IOBAN 66X45 STRL (DRAPES) ×1 IMPLANT
DRAPE SURG ORHT 6 SPLT 77X108 (DRAPES) ×2 IMPLANT
DRAPE U-SHAPE 47X51 STRL (DRAPES) ×1 IMPLANT
DRILL SHORT 3.2MM (DRILL) ×1
DRSG AQUACEL AG ADV 3.5X 4 (GAUZE/BANDAGES/DRESSINGS) IMPLANT
DRSG AQUACEL AG ADV 3.5X 6 (GAUZE/BANDAGES/DRESSINGS) IMPLANT
DRSG TEGADERM 4X4.75 (GAUZE/BANDAGES/DRESSINGS) IMPLANT
DURAPREP 26ML APPLICATOR (WOUND CARE) ×1 IMPLANT
ELECT REM PT RETURN 9FT ADLT (ELECTROSURGICAL) ×1
ELECTRODE REM PT RTRN 9FT ADLT (ELECTROSURGICAL) ×1 IMPLANT
GAUZE PAD ABD 8X10 STRL (GAUZE/BANDAGES/DRESSINGS) ×1 IMPLANT
GAUZE SPONGE 4X4 12PLY STRL (GAUZE/BANDAGES/DRESSINGS) IMPLANT
GLOVE BIO SURGEON STRL SZ7.5 (GLOVE) ×2 IMPLANT
GLOVE BIOGEL PI IND STRL 8 (GLOVE) ×2 IMPLANT
GOWN STRL REUS W/ TWL LRG LVL3 (GOWN DISPOSABLE) ×1 IMPLANT
GOWN STRL REUS W/ TWL XL LVL3 (GOWN DISPOSABLE) ×2 IMPLANT
GUIDEROD SS 2.5MMX280MM (ROD) IMPLANT
KIT BASIN OR (CUSTOM PROCEDURE TRAY) ×1 IMPLANT
KIT TURNOVER KIT B (KITS) ×1 IMPLANT
MANIFOLD NEPTUNE II (INSTRUMENTS) ×1 IMPLANT
NAIL HUM LONG STRT 8.5X255 RT (Nail) IMPLANT
NS IRRIG 1000ML POUR BTL (IV SOLUTION) ×1 IMPLANT
PACK SHOULDER (CUSTOM PROCEDURE TRAY) ×1 IMPLANT
PAD ARMBOARD 7.5X6 YLW CONV (MISCELLANEOUS) ×2 IMPLANT
ROD REAMING 2.5 (INSTRUMENTS) IMPLANT
SCREW LOCK 4X24 TI (Screw) IMPLANT
SCREW LOCKING 4.5X32 (Screw) IMPLANT
SCREW TI MULTILOC 4.5X42 (Screw) IMPLANT
SLING ARM FOAM STRAP LRG (SOFTGOODS) IMPLANT
SLING ARM FOAM STRAP MED (SOFTGOODS) IMPLANT
SPONGE T-LAP 18X18 ~~LOC~~+RFID (SPONGE) IMPLANT
SPONGE T-LAP 4X18 ~~LOC~~+RFID (SPONGE) IMPLANT
STRIP CLOSURE SKIN 1/2X4 (GAUZE/BANDAGES/DRESSINGS) IMPLANT
SUCTION TUBE FRAZIER 10FR DISP (SUCTIONS) ×1 IMPLANT
SUT FIBERWIRE #2 38 T-5 BLUE (SUTURE)
SUT MNCRL AB 4-0 PS2 18 (SUTURE) IMPLANT
SUT MNCRL+ AB 3-0 CT1 36 (SUTURE) IMPLANT
SUT MON AB 2-0 CT1 36 (SUTURE) IMPLANT
SUT VIC AB 2-0 CT1 TAPERPNT 27 (SUTURE) ×1 IMPLANT
SUTURE FIBERWR #2 38 T-5 BLUE (SUTURE) IMPLANT
SYR CONTROL 10ML LL (SYRINGE) IMPLANT
TOWEL GREEN STERILE (TOWEL DISPOSABLE) ×1 IMPLANT
TOWEL GREEN STERILE FF (TOWEL DISPOSABLE) ×1 IMPLANT
WATER STERILE IRR 1000ML POUR (IV SOLUTION) ×1 IMPLANT
YANKAUER SUCT BULB TIP NO VENT (SUCTIONS) ×1 IMPLANT

## 2023-03-20 NOTE — Brief Op Note (Signed)
03/20/2023  2:07 PM  PATIENT:  Kathleen Keller  83 y.o. female  PRE-OPERATIVE DIAGNOSIS:  Right proximal humerus fracture  POST-OPERATIVE DIAGNOSIS:  Right proximal humerus fracture  PROCEDURE:  Procedure(s): OPEN REDUCTION INTERNAL FIXATION (ORIF) PROXIMAL HUMERUS FRACTURE (Right)  SURGEON:  Surgeons and Role:    * Yolonda Kida, MD - Primary  PHYSICIAN ASSISTANT: Dion Saucier, PA-C  ANESTHESIA:   regional and general  EBL:  50 cc   BLOOD ADMINISTERED:none  DRAINS: none   LOCAL MEDICATIONS USED:  NONE  SPECIMEN:  No Specimen  DISPOSITION OF SPECIMEN:  N/A  COUNTS:  YES  TOURNIQUET:  * No tourniquets in log *  DICTATION: .Note written in EPIC  PLAN OF CARE: Admit for overnight observation  PATIENT DISPOSITION:  PACU - hemodynamically stable.   Delay start of Pharmacological VTE agent (>24hrs) due to surgical blood loss or risk of bleeding: not applicable

## 2023-03-20 NOTE — H&P (Signed)
ORTHOPAEDIC H and P  REQUESTING PHYSICIAN: Yolonda Kida, MD  PCP:  Roe Rutherford, NP  Chief Complaint: Right proximal humerus fracture  HPI: Kathleen Keller is a 83 y.o. female who complains of right arm and shoulder pain following a fall back about a week ago.  She is here today for internal fixation of the right humerus fracture.  No new complaints.  Accompanied by her daughter.  Past Medical History:  Diagnosis Date   Anxiety    Arthritis    Essential hypertension    GERD (gastroesophageal reflux disease)    GERD with esophagitis    Hypertension    Hypothyroidism    Iron deficiency anemia    Leucopenia    MDD (major depressive disorder)    OAB (overactive bladder)    Thyroid disease    Vertigo    Vitamin B12 deficiency    Past Surgical History:  Procedure Laterality Date   APPENDECTOMY     BACK SURGERY  2009   anterior cervical   COLONOSCOPY WITH PROPOFOL N/A 12/01/2022   Procedure: COLONOSCOPY WITH PROPOFOL;  Surgeon: Franky Macho, MD;  Location: [facility];  Service: Endoscopy;  Laterality: N/A;  11:15AM;ASA 1-2   DIAGNOSTIC LAPAROSCOPY     EYE SURGERY Bilateral    cataracts   HIATAL HERNIA REPAIR  2007   KNEE SURGERY Left    torn meniscus   POLYPECTOMY  12/01/2022   Procedure: POLYPECTOMY;  Surgeon: Franky Macho, MD;  Location: [facility];  Service: Endoscopy;;   REDUCTION MAMMAPLASTY Bilateral    TONSILLECTOMY     VAGINAL HYSTERECTOMY     Social History   Socioeconomic History   Marital status: Divorced    Spouse name: Not on file   Number of children: Not on file   Years of education: Not on file   Highest education level: Not on file  Occupational History   Not on file  Tobacco Use   Smoking status: Never   Smokeless tobacco: Never  Vaping Use   Vaping status: Never Used  Substance and Sexual Activity   Alcohol use: Not Currently   Drug use: Not Currently   Sexual activity: Not on file  Other Topics Concern    Not on file  Social History Narrative   Not on file   Social Drivers of Health   Financial Resource Strain: Medium Risk (07/08/2022)   [facility]   Overall Financial Resource Strain (CARDIA)    Difficulty of Paying Living Expenses: Somewhat hard  Food Insecurity: No Food Insecurity (07/08/2022)   Received from [facility]   Hunger Vital Sign    Worried About Running Out of Food in the Last Year: Never true    Ran Out of Food in the Last Year: Never true  Transportation Needs: No Transportation Needs (07/08/2022)   Received from [facility] - Transportation    Lack of Transportation (Medical): No    Lack of Transportation (Non-Medical): No  Physical Activity: Unknown (07/08/2022)   Received from [facility]   Exercise Vital Sign    Days of Exercise per Week: 4 days    Minutes of Exercise per Session: Not on file  Stress: Stress Concern Present (07/08/2022)   Received from [facility] - Occupational Stress Questionnaire    Feeling of Stress : Rather much  Social Connections: Moderately Integrated (07/08/2022)   Received from [facility]   Social Network  How would you rate your social network (family, work, friends)?: Adequate participation with social networks   History reviewed. No pertinent family history. Allergies  Allergen Reactions   Codeine Nausea And Vomiting   Demerol [Meperidine Hcl] Nausea And Vomiting   Penicillins Anaphylaxis   Propoxyphene Nausea And Vomiting   Prior to Admission medications   Medication Sig Start Date End Date Taking? Authorizing Provider  ALPRAZolam Prudy Feeler) 0.25 MG tablet Take 0.25 mg by mouth daily as needed for anxiety. 07/11/22  Yes [provider]  aspirin 81 MG EC tablet Take 81 mg by mouth daily. 11/21/98  Yes [provider]  Cholecalciferol (VITAMIN D3) 50 MCG (2000 UT) capsule Take 2,000 Units by mouth daily.   Yes [provider]   diclofenac Sodium (VOLTAREN ARTHRITIS PAIN) 1 % GEL Apply 2 g topically daily as needed (Pain).   Yes [provider]  diphenoxylate-atropine (LOMOTIL) 2.5-0.025 MG tablet Take 1-2 tablets by mouth 4 (four) times daily as needed for diarrhea or loose stools. 02/17/23  Yes Pollina, Canary Brim, MD  DULoxetine (CYMBALTA) 20 MG capsule Take 20 mg by mouth 2 (two) times daily.   Yes [provider]  levothyroxine (SYNTHROID) 75 MCG tablet Take 75 mcg by mouth daily before breakfast.   Yes [provider]  lisinopril (ZESTRIL) 10 MG tablet Take 10 mg by mouth daily.   Yes [provider]  omeprazole (PRILOSEC) 40 MG capsule Take 40 mg by mouth in the morning. 08/21/20  Yes [provider]  oxyCODONE-acetaminophen (PERCOCET/ROXICET) 5-325 MG tablet Take 1 tablet by mouth every 6 (six) hours as needed for severe pain (pain score 7-10). Patient taking differently: Take 1 tablet by mouth every 6 (six) hours. 03/15/23  Yes Beatty, Celeste A, PA-C  oxyCODONE (ROXICODONE) 5 MG immediate release tablet Take 1 tablet (5 mg total) by mouth every 6 (six) hours as needed for severe pain (pain score 7-10). Patient not taking: Reported on 03/18/2023 03/15/23   Carmel Sacramento A, PA-C   No results found.  Positive ROS: All other systems have been reviewed and were otherwise negative with the exception of those mentioned in the HPI and as above.  Physical Exam: General: Alert, no acute distress Cardiovascular: No pedal edema Respiratory: No cyanosis, no use of accessory musculature GI: No organomegaly, abdomen is soft and non-tender Skin: No lesions in the area of chief complaint Neurologic: Sensation intact distally Psychiatric: Patient is competent for consent with normal mood and affect Lymphatic: No axillary or cervical lymphadenopathy  MUSCULOSKELETAL: Right upper extremity has fairly diffuse ecchymosis down along the distal brachium and into the antecubital  fossa.  This is consistent with the injury.  Distally she is neurovascularly intact.  Assessment: Right proximal humerus fracture  Plan: Plan proceed today with open reduction internal fixation.  We again reviewed the risk and benefits in detail.  The risks, benefits, and alternatives were discussed with the patient. There are risks associated with the surgery including, but not limited to, problems with anesthesia (death), infection, differences in leg length/angulation/rotation, fracture of bones, loosening or failure of implants, malunion, nonunion, hematoma (blood accumulation) which may require surgical drainage, blood clots, pulmonary embolism, nerve injury (foot drop), and blood vessel injury. The patient understands these risks and elects to proceed.   Plan for discharge home postoperatively from PACU.    Yolonda Kida, MD Cell [phone]    03/20/2023 10:33 AM

## 2023-03-20 NOTE — Anesthesia Postprocedure Evaluation (Signed)
Anesthesia Post Note  Patient: Kathleen Keller  Procedure(s) Performed: OPEN REDUCTION INTERNAL FIXATION (ORIF) PROXIMAL HUMERUS FRACTURE (Right: Arm Upper)     Patient location during evaluation: PACU Anesthesia Type: General and Regional Level of consciousness: awake and alert Pain management: pain level controlled Vital Signs Assessment: post-procedure vital signs reviewed and stable Respiratory status: spontaneous breathing, nonlabored ventilation, respiratory function stable and patient connected to nasal cannula oxygen Cardiovascular status: blood pressure returned to baseline and stable Postop Assessment: no apparent nausea or vomiting Anesthetic complications: no   No notable events documented.  Last Vitals:  Vitals:   03/20/23 1445 03/20/23 1500  BP: (!) 159/66 108/84  Pulse: 75 79  Resp: 18 18  Temp:  36.6 C  SpO2: 99% 100%    Last Pain:  Vitals:   03/20/23 1445  PainSc: 0-No pain                 Earl Lites P Fredia Chittenden

## 2023-03-20 NOTE — Discharge Instructions (Signed)
Orthopedic surgery discharge instructions:  -Maintain postoperative bandage until your follow-up appointment.  This bandage is waterproof and you may shower and that bandage beginning on postoperative day #2.  Please do not submerge the shoulder underwater.  -  No lifting above shoulder height for the first 2 weeks.  You may move the elbow, hand and wrist as tolerated.  Again and you are free to use the right arm for activities of daily living such as eating, getting dressed, and bathing and doing schoolwork.  -Apply ice to the operative shoulder along the surgical bandage for 20 to 30 minutes out of each hour.  Do this as frequently as possible throughout the day.  -For mild to moderate pain you should use Tylenol and or Advil in alternating fashion.  Do this throughout the day and utilize hydrocodone as needed for breakthrough pain. you should try to wean off of the hydrocodone over the next 3-5 days.  -You will return to see Dr. Aundria Rud in the office in 2 weeks for postoperative x-rays and wound check.

## 2023-03-20 NOTE — Anesthesia Procedure Notes (Signed)
Procedure Name: Intubation Date/Time: 03/20/2023 12:42 PM  Performed by: Berniece Pap, CRNAPre-anesthesia Checklist: Patient identified, Emergency Drugs available, Suction available and Patient being monitored Patient Re-evaluated:Patient Re-evaluated prior to induction Oxygen Delivery Method: Circle system utilized Preoxygenation: Pre-oxygenation with 100% oxygen Induction Type: IV induction Ventilation: Mask ventilation without difficulty Laryngoscope Size: Mac and 3 Grade View: Grade II Tube type: Oral Tube size: 7.0 mm Number of attempts: 1 Airway Equipment and Method: Stylet and Oral airway Placement Confirmation: ETT inserted through vocal cords under direct vision, positive ETCO2 and breath sounds checked- equal and bilateral Secured at: 21 cm Tube secured with: Tape Dental Injury: Teeth and Oropharynx as per pre-operative assessment

## 2023-03-20 NOTE — Anesthesia Procedure Notes (Signed)
Anesthesia Regional Block: Interscalene brachial plexus block   Pre-Anesthetic Checklist: , timeout performed,  Correct Patient, Correct Site, Correct Laterality,  Correct Procedure, Correct Position, site marked,  Risks and benefits discussed,  Surgical consent,  Pre-op evaluation,  At surgeon's request and post-op pain management  Laterality: Right  Prep: chloraprep       Needles:  Injection technique: Single-shot  Needle Type: Echogenic Stimulator Needle     Needle Length: 10cm  Needle Gauge: 21   Needle insertion depth: 6 cm   Additional Needles:   Procedures:,,,, ultrasound used (permanent image in chart),,   Motor weakness within 5 minutes.  Narrative:  Start time: 03/20/2023 12:13 PM End time: 03/20/2023 12:18 PM Injection made incrementally with aspirations every 5 mL.  Performed by: Personally  Anesthesiologist: Mal Amabile, MD  Additional Notes: Timeout performed. Patient sedated. Relevant anatomy ID'd using [place]. Incremental 2-53ml injection of LA with frequent aspiration. Patient tolerated procedure well.

## 2023-03-20 NOTE — Transfer of Care (Signed)
Immediate Anesthesia Transfer of Care Note  Patient: Kathleen Keller  Procedure(s) Performed: OPEN REDUCTION INTERNAL FIXATION (ORIF) PROXIMAL HUMERUS FRACTURE (Right: Arm Upper)  Patient Location: PACU  Anesthesia Type:General  Level of Consciousness: awake and alert   Airway & Oxygen Therapy: Patient Spontanous Breathing and Patient connected to face mask oxygen  Post-op Assessment: Report given to RN and Post -op Vital signs reviewed and stable  Post vital signs: Reviewed and stable  Last Vitals:  Vitals Value Taken Time  BP 161/65 03/20/23 1416  Temp 36.6 C 03/20/23 1415  Pulse 75 03/20/23 1415  Resp 18 03/20/23 1420  SpO2 100 % 03/20/23 1415  Vitals shown include unfiled device data.  Last Pain:  Vitals:   03/20/23 1137  PainSc: 0-No pain         Complications: No notable events documented.

## 2023-03-20 NOTE — Plan of Care (Signed)

## 2023-03-20 NOTE — Op Note (Signed)
03/20/2023  2:09 PM  PATIENT:  Kathleen Keller    PRE-OPERATIVE DIAGNOSIS:  Right humerus midshaft transverse fracture  POST-OPERATIVE DIAGNOSIS:  Same  PROCEDURE:  OPEN REDUCTION INTERNAL FIXATION (ORIF)  HUMERUS FRACTURE with intramedullary nail  SURGEON:  Yolonda Kida, MD  PHYSICIAN ASSISTANT: Dion Saucier, PA-C, present and scrubbed throughout the case, critical for completion in a timely fashion, and for retraction, instrumentation, and closure.  ANESTHESIA:   General With interscalene  PREOPERATIVE INDICATIONS:  Kathleen Keller is a  83 y.o. female with a diagnosis of Right midshaft transverse humerus fracture who elected for surgical management.    The risks benefits and alternatives were discussed with the patient including but not limited to the risks of nonoperative treatment, versus surgical intervention including infection, bleeding, nerve injury, malunion, nonunion, the need for revision surgery, hardware prominence, hardware failure, the need for hardware removal, blood clots, cardiopulmonary complications, conversion to arthroplasty, morbidity, mortality, among others, and they were willing to proceed.  Predicted outcome is good, although there will be at least a six to nine month expected recovery.   OPERATIVE IMPLANTS:  Synthes humeral nail size 8.5 x 155 right. 2 proximal interlock screws and 1 distal interlock screw  OPERATIVE FINDINGS: Displaced midshaft humerus fracture.  UNIQUE ASPECTS OF THE CASE: 200% translated and at least 2 cm shortened midshaft humerus fracture..  OPERATIVE PROCEDURE: The patient was brought to the operating room and placed in the supine position. General anesthesia was administered. IV antibiotics were given. She was placed in the beach chair position. All bony prominences were padded. The upper extremity was prepped and draped in usual sterile fashion.    We began the procedure with achieving an anatomic proximal starting point to  the canal.  We did this by establishing a percutaneous incision just anterior to the [facility] joint.  Skin dissection was then carried down to subcutaneous tissue and the deltoid was bluntly spread open.  The rotator cuff musculature was absent and this rotator cuff deficient shoulder.  Guidepin was placed in the center position of the humeral head on AP and lateral fluoroscopic imaging.  We then introduced the opening canal reamer.  Once we had access to the canal the guide rod was placed.  Reduction maneuvers were administered to the fracture site.  We then passed the wire down to the distal humerus.  We then measured for the appropriate length of the humeral nail, this was 155 mm.  We then reamed up to 9.5 mm reamer to accept a 0.5 mm nail.  We had chatter starting at 8 mm.  We then went up by 1/2 mm until we achieved 9.5 mm.  We then malleted the nail down over the guidewire.  We had excellent fit, however did notice some diastases at the fracture site.  We then moved our attention to the distal interlock screw once the nail had been placed just deeper than typically we would to except some back slap to reduce the fracture.  The distal interlock screw was placed with perfect circle technique.  Once this was bicortical we confirmed on AP and lateral.  We then placed the back slap screw into the jig for this nail.  We then back slapped and created excellent compression across the fracture.  Our attention was then moved to the proximal screws.  2 proximal screws were placed through the nail jig by first drilling across the lateral cortex and into the medial cortex but not across to avoid bicortical screw.  We  then measured and placed the screws by hand.  Final x-rays were obtained AP and lateral of the proximal humerus, midshaft and distal to review placement of the nail as well as interlocking screws and reduction fracture.   Once complete fixation and reduction of been achieved, took final C-arm pictures, and  irrigated the wounds copiously, closed subcutaneous tissue with Monocryl and Steri-Strips for the skin. She was placed in a sling. She had a preoperative regional block as well. She tolerated the procedure well with no complications.   Disposition:  Ms. Kathleen Keller will be able to use the arm immediately for activities of daily living.  She may discontinue her sling once her block has resolved.  No lifting over 2 pounds until we see her back in the office for x-rays.  She will be admitted for observation and discharge home tomorrow.  I will see her back in the office in 2 weeks for routine postoperative check as well as AP and lateral of the right humerus.

## 2023-03-21 DIAGNOSIS — S42321A Displaced transverse fracture of shaft of humerus, right arm, initial encounter for closed fracture: Secondary | ICD-10-CM | POA: Diagnosis not present

## 2023-03-21 NOTE — Care Management Obs Status (Signed)
 MEDICARE OBSERVATION STATUS NOTIFICATION   Patient Details  Name: Kathleen Keller MRN: [id number] Date of Birth: 28-Jul-1940   Medicare Observation Status Notification Given:  Yes    Isaias Cowman, RN 03/21/2023, 3:13 PM

## 2023-03-21 NOTE — Progress Notes (Signed)
 Orthopedic Tech Progress Note Patient Details:  Kathleen Keller 10/08/1940 [id number]  Ortho Devices Type of Ortho Device: Shoulder immobilizer Ortho Device/Splint Location: RUE Ortho Device/Splint Interventions: Application   Post Interventions Patient Tolerated: Well  [name] 03/21/2023, 11:37 AM

## 2023-03-21 NOTE — Progress Notes (Signed)
 OT is recommending a 3-in-1 BSC. Met with pt. Pt lives alone. She plans to return home with the support of her family. Her daughter is going to take her home at time of DC. She doesn't have a preference for a DME agency. Will contact [facility] for DME referral and she agreed. Contacted Ada with [facility] for DME referral.

## 2023-03-21 NOTE — Care Management (Cosign Needed)
    Durable Medical Equipment  (From admission, onward)           Start     Ordered   03/21/23 1506  For home use only DME Bedside commode  Once       Question:  Patient needs a bedside commode to treat with the following condition  Answer:  Weakness   03/21/23 1507

## 2023-03-21 NOTE — Evaluation (Signed)
 Occupational Therapy Evaluation Patient Details Name: Kathleen Keller MRN: [id number] DOB: [birth year] Today's Date: 03/21/2023   History of Present Illness   Pt is an 83 y/o F presenting to ED on 2/21 wtih R shoulder pain after fall ~1 week ago when she fell off her porch. Found to have R proximal humerus fx, s/p ORIF on 2/21.     Clinical Impressions Pt reports ind at baseline with ADLs/functional mobility, lives alone but states her daughter will come assist PRN. Pt currently needing up to min A for ADLs, mod I for bed mobility and transfers. Pt educated on AROM and weightbearing precautions for RUE, sling wear, and compensatory strategies for ADLs, handouts provided. Pt with difficulty keeping R hand in sling, ordered large size and educated pt on donning/doffing and wear schedule. Pt verbalized understanding. Pt presenting with impairments listed below, will follow acutely. Recommend HHOT at d/c.     If plan is discharge home, recommend the following:   A little help with walking and/or transfers;A little help with bathing/dressing/bathroom;Assistance with cooking/housework;Direct supervision/assist for medications management;Direct supervision/assist for financial management;Assist for transportation;Help with stairs or ramp for entrance;Supervision due to cognitive status     Functional Status Assessment   Patient has had a recent decline in their functional status and demonstrates the ability to make significant improvements in function in a reasonable and predictable amount of time.     Equipment Recommendations   BSC/3in1     Recommendations for Other Services   PT consult     Precautions/Restrictions   Precautions Precautions: Shoulder Type of Shoulder Precautions: NWB LUE, sling OOB, ok for AROM/ROM shoulder FF 0-90, Abd 0-60, ER 0-30 Precaution Booklet Issued: Yes (comment) Recall of Precautions/Restrictions: Intact Required Braces or Orthoses:  Sling Restrictions Weight Bearing Restrictions Per Provider Order: Yes RUE Weight Bearing Per Provider Order: Non weight bearing     Mobility Bed Mobility Overal bed mobility: Modified Independent             General bed mobility comments: getting OOB to L side, pt reports she plans to sleep in recliner at home    Transfers Overall transfer level: Modified independent                        Balance Overall balance assessment: Mild deficits observed, not formally tested                                         ADL either performed or assessed with clinical judgement   ADL Overall ADL's : Needs assistance/impaired Eating/Feeding: Set up   Grooming: Set up   Upper Body Bathing: Minimal assistance   Lower Body Bathing: Minimal assistance   Upper Body Dressing : Minimal assistance   Lower Body Dressing: Minimal assistance   Toilet Transfer: Contact guard assist;Transfer board;Regular Toilet   Toileting- Clothing Manipulation and Hygiene: Contact guard assist       Functional mobility during ADLs: Contact guard assist       Vision   Vision Assessment?: No apparent visual deficits     Perception Perception: Not tested       Praxis Praxis: Not tested       Pertinent Vitals/Pain Pain Assessment Pain Assessment: No/denies pain     Extremity/Trunk Assessment Upper Extremity Assessment Upper Extremity Assessment: RUE deficits/detail RUE Deficits / Details: s/p R humerus ORIF, nerve block intact  throughout, can perform wrist flex/ext, digit composite flex/ext and pronation/supination RUE Sensation: decreased light touch;decreased proprioception (nerve block) RUE Coordination: decreased fine motor;decreased gross motor   Lower Extremity Assessment Lower Extremity Assessment: Overall WFL for tasks assessed   Cervical / Trunk Assessment Cervical / Trunk Assessment: Normal   Communication Communication Communication: No  apparent difficulties   Cognition Arousal: Alert Behavior During Therapy: WFL for tasks assessed/performed Cognition: No apparent impairments                               Following commands: Intact       Cueing  General Comments          Exercises Exercises: Shoulder Shoulder Exercises Shoulder Flexion: Other (comment) (demo'd only to 90* per orders) Shoulder ABduction: Other (comment) (demo'd only to 60* per orders) Elbow Flexion: AAROM, Right, 10 reps, Seated Elbow Extension: AAROM, Right, 10 reps, Seated Wrist Flexion: AROM, Right, 10 reps, Seated Wrist Extension: AROM, Right, 10 reps, Seated Digit Composite Flexion: AROM, Right, 10 reps, Seated Composite Extension: AROM, Right, 10 reps, Seated   Shoulder Instructions Shoulder Instructions Donning/doffing shirt without moving shoulder: Minimal assistance Method for sponge bathing under operated UE: Patient able to independently direct caregiver Donning/doffing sling/immobilizer: Patient able to independently direct caregiver Correct positioning of sling/immobilizer: Patient able to independently direct caregiver ROM for elbow, wrist and digits of operated UE: Supervision/safety Sling wearing schedule (on at all times/off for ADL's): Supervision/safety    Home Living Family/patient expects to be discharged to:: Private residence Living Arrangements: Alone Available Help at Discharge: Family;Available PRN/intermittently;Friend(s) Type of Home: House Home Access: Stairs to enter [employer] of Steps: 5   Home Layout: One level     Bathroom Shower/Tub: Chief Strategy Officer: Standard     Home Equipment: Grab bars - tub/shower;Cane - quad          Prior Functioning/Environment Prior Level of Function : Independent/Modified Independent;Driving             Mobility Comments: quad cane ADLs Comments: ind    OT Problem List: Decreased strength;Decreased range of  motion;Decreased activity tolerance   OT Treatment/Interventions: Self-care/ADL training;Therapeutic exercise;Energy conservation;DME and/or AE instruction;Therapeutic activities;Balance training;Patient/family education      OT Goals(Current goals can be found in the care plan section)   Acute Rehab OT Goals Patient Stated Goal: none stated OT Goal Formulation: With patient Time For Goal Achievement: 04/04/23 Potential to Achieve Goals: Good ADL Goals Pt Will Perform Upper Body Dressing: Independently;sitting Pt Will Perform Lower Body Dressing: Independently;sitting/lateral leans;sit to/from stand Pt Will Transfer to Toilet: Independently;ambulating;regular height toilet Pt Will Perform Tub/Shower Transfer: Tub transfer;Shower transfer;Independently;ambulating Pt/caregiver will Perform Home Exercise Program: Increased ROM;Increased strength;With written HEP provided;Independently;Right Upper extremity   OT Frequency:  Min 1X/week    Co-evaluation              AM-PAC OT "6 Clicks" Daily Activity     Outcome Measure Help from another person eating meals?: None Help from another person taking care of personal grooming?: A Little Help from another person toileting, which includes using toliet, bedpan, or urinal?: A Little Help from another person bathing (including washing, rinsing, drying)?: A Little Help from another person to put on and taking off regular upper body clothing?: A Little Help from another person to put on and taking off regular lower body clothing?: A Little 6 Click Score: 19   End of  Session Nurse Communication: Mobility status  Activity Tolerance: Patient tolerated treatment well Patient left: in bed;with call bell/phone within reach  OT Visit Diagnosis: Unsteadiness on feet (R26.81);Other abnormalities of gait and mobility (R26.89);Muscle weakness (generalized) (M62.81)                Time: 4098-1191 OT Time Calculation (min): 46 min Charges:  OT  General Charges $OT Visit: 1 Visit OT Evaluation $OT Eval Moderate Complexity: 1 Mod OT Treatments $Self Care/Home Management : 23-37 mins  Carver Fila, OTD, OTR/L SecureChat Preferred Acute Rehab [phone]   Dalphine Handing 03/21/2023, 12:43 PM

## 2023-03-21 NOTE — Progress Notes (Signed)
 Patient ID: Kathleen Keller, female   DOB: 1940/08/20, 83 y.o.   MRN: [id number] Subjective: 1 Day Post-Op Procedure(s) (LRB): OPEN REDUCTION INTERNAL FIXATION (ORIF) PROXIMAL HUMERUS FRACTURE (Right)    Patient reports pain as mild at this point related to pre-operative block.  Limited use of RUE other than at level of her hand and wrist at this point.  Objective:   VITALS:   Vitals:   03/21/23 0423 03/21/23 0747  BP: (!) 136/50 (!) 141/55  Pulse: 63 68  Resp: 18 18  Temp: 98.7 F (37.1 C) 98.4 F (36.9 C)  SpO2: 96% 97%    Incision: dressing C/D/I - right shoulder and lower arm dressings intact  LABS Recent Labs    03/20/23 1824  HGB 10.4*  HCT 32.6*  WBC 8.8  PLT 247    Recent Labs    03/20/23 1824  CREATININE 0.86    No results for input(s): "LABPT", "INR" in the last 72 hours.   Assessment/Plan: 1 Day Post-Op Procedure(s) (LRB): OPEN REDUCTION INTERNAL FIXATION (ORIF) PROXIMAL HUMERUS FRACTURE (Right)   Advance diet Assess mobility  Plan for discharge today if she does well with her mobility  RTC in 2 weeks with [place] [facility] in sling with OOB activity until further directed

## 2023-03-24 ENCOUNTER — Encounter (HOSPITAL_COMMUNITY): Payer: Self-pay | Admitting: Orthopedic Surgery

## 2023-03-24 NOTE — Discharge Summary (Signed)
 Patient ID: Kathleen Keller MRN: [id number] DOB/AGE: 1941-01-07 83 y.o.  Admit date: 03/20/2023 Discharge date: 03/21/2023  Primary Diagnosis: Right humerus shaft fracture  Admission Diagnoses:  s/p IMN of humerus shaft fracture  Past Medical History:  Diagnosis Date   Anxiety    Arthritis    Essential hypertension    GERD (gastroesophageal reflux disease)    GERD with esophagitis    Hypertension    Hypothyroidism    Iron deficiency anemia    Leucopenia    MDD (major depressive disorder)    OAB (overactive bladder)    Thyroid disease    Vertigo    Vitamin B12 deficiency    Discharge Diagnoses:   Principal Problem:   Closed displaced transverse fracture of shaft of right humerus, initial encounter  Estimated body mass index is 26.26 kg/m as calculated from the following:   Height as of this encounter: 5\' 1"  (1.549 m).   Weight as of this encounter: 63 kg.  Procedure:  Procedure(s) (LRB): OPEN REDUCTION INTERNAL FIXATION (ORIF) PROXIMAL HUMERUS FRACTURE (Right)   Consults: None  HPI: Kathleen Keller An 83 year old female seen in the ER initially with mid shaft humerus fracture but significantly displaced.  She was seen in the office pronation was made for operative treatment.  She presented to the hospital on 03/20/2023 for operative treatment.  She was admitted for postoperative monitoring.  Laboratory Data: Admission on 03/20/2023, Discharged on 03/21/2023  Component Date Value Ref Range Status   WBC 03/20/2023 8.8  4.0 - 10.5 K/uL Final   RBC 03/20/2023 3.68 (L)  3.87 - 5.11 MIL/uL Final   Hemoglobin 03/20/2023 10.4 (L)  12.0 - 15.0 g/dL Final   HCT 82/95/6213 32.6 (L)  36.0 - 46.0 % Final   MCV 03/20/2023 88.6  80.0 - 100.0 fL Final   MCH 03/20/2023 28.3  26.0 - 34.0 pg Final   MCHC 03/20/2023 31.9  30.0 - 36.0 g/dL Final   RDW 08/65/7846 13.2  11.5 - 15.5 % Final   Platelets 03/20/2023 247  150 - 400 K/uL Final   nRBC 03/20/2023 0.0  0.0 - 0.2 % Final   Performed at  [facility], [address], [place], [place] [address]   Creatinine, Ser 03/20/2023 0.86  0.44 - 1.00 mg/dL Final   GFR, Estimated 03/20/2023 >60  >60 mL/min Final   Comment: (NOTE) Calculated using the CKD-EPI Creatinine Equation (2021) Performed at [facility], [address] [place], [place] [address]   Admission on 03/15/2023, Discharged on 03/15/2023  Component Date Value Ref Range Status   WBC 03/15/2023 16.5 (H)  4.0 - 10.5 K/uL Final   RBC 03/15/2023 4.23  3.87 - 5.11 MIL/uL Final   Hemoglobin 03/15/2023 12.4  12.0 - 15.0 g/dL Final   HCT 24/40/1027 38.3  36.0 - 46.0 % Final   MCV 03/15/2023 90.5  80.0 - 100.0 fL Final   MCH 03/15/2023 29.3  26.0 - 34.0 pg Final   MCHC 03/15/2023 32.4  30.0 - 36.0 g/dL Final   RDW 25/36/6440 13.2  11.5 - 15.5 % Final   Platelets 03/15/2023 274  150 - 400 K/uL Final   nRBC 03/15/2023 0.0  0.0 - 0.2 % Final   Neutrophils Relative % 03/15/2023 90  % Final   Neutro Abs 03/15/2023 14.8 (H)  1.7 - 7.7 K/uL Final   Lymphocytes Relative 03/15/2023 5  % Final   Lymphs Abs 03/15/2023 0.9  0.7 - 4.0 K/uL Final   Monocytes  Relative 03/15/2023 4  % Final   Monocytes Absolute 03/15/2023 0.7  0.1 - 1.0 K/uL Final   Eosinophils Relative 03/15/2023 0  % Final   Eosinophils Absolute 03/15/2023 0.0  0.0 - 0.5 K/uL Final   Basophils Relative 03/15/2023 0  % Final   Basophils Absolute 03/15/2023 0.1  0.0 - 0.1 K/uL Final   Immature Granulocytes 03/15/2023 1  % Final   Abs Immature Granulocytes 03/15/2023 0.09 (H)  0.00 - 0.07 K/uL Final   Performed at [facility], [address], [place], [place] [address]   Sodium 03/15/2023 135  135 - 145 mmol/L Final   Potassium 03/15/2023 3.7  3.5 - 5.1 mmol/L Final   Chloride 03/15/2023 102  98 - 111 mmol/L Final   CO2 03/15/2023 24  22 - 32 mmol/L Final   Glucose, Bld 03/15/2023 137 (H)  70 - 99 mg/dL Final   Glucose reference range applies only to samples taken after fasting for at least 8 hours.    BUN 03/15/2023 22  8 - 23 mg/dL Final   Creatinine, Ser 03/15/2023 0.81  0.44 - 1.00 mg/dL Final   Calcium 60/45/4098 9.5  8.9 - 10.3 mg/dL Final   GFR, Estimated 03/15/2023 >60  >60 mL/min Final   Comment: (NOTE) Calculated using the CKD-EPI Creatinine Equation (2021)    Anion gap 03/15/2023 9  5 - 15 Final   Performed at [facility], [address] [place], [place] [address]  Admission on 02/16/2023, Discharged on 02/17/2023  Component Date Value Ref Range Status   Glucose-Capillary 02/16/2023 104 (H)  70 - 99 mg/dL Final   Glucose reference range applies only to samples taken after fasting for at least 8 hours.   WBC 02/16/2023 6.2  4.0 - 10.5 K/uL Final   RBC 02/16/2023 4.06  3.87 - 5.11 MIL/uL Final   Hemoglobin 02/16/2023 11.9 (L)  12.0 - 15.0 g/dL Final   HCT 78/29/5621 37.0  36.0 - 46.0 % Final   MCV 02/16/2023 91.1  80.0 - 100.0 fL Final   MCH 02/16/2023 29.3  26.0 - 34.0 pg Final   MCHC 02/16/2023 32.2  30.0 - 36.0 g/dL Final   RDW 30/86/5784 13.9  11.5 - 15.5 % Final   Platelets 02/16/2023 291  150 - 400 K/uL Final   nRBC 02/16/2023 0.0  0.0 - 0.2 % Final   Neutrophils Relative % 02/16/2023 64  % Final   Neutro Abs 02/16/2023 4.1  1.7 - 7.7 K/uL Final   Lymphocytes Relative 02/16/2023 23  % Final   Lymphs Abs 02/16/2023 1.4  0.7 - 4.0 K/uL Final   Monocytes Relative 02/16/2023 8  % Final   Monocytes Absolute 02/16/2023 0.5  0.1 - 1.0 K/uL Final   Eosinophils Relative 02/16/2023 3  % Final   Eosinophils Absolute 02/16/2023 0.2  0.0 - 0.5 K/uL Final   Basophils Relative 02/16/2023 1  % Final   Basophils Absolute 02/16/2023 0.0  0.0 - 0.1 K/uL Final   Immature Granulocytes 02/16/2023 1  % Final   Abs Immature Granulocytes 02/16/2023 0.03  0.00 - 0.07 K/uL Final   Performed at [facility], [address] [place], [place] [address]   Sodium 02/16/2023 136  135 - 145 mmol/L Final   Potassium 02/16/2023 3.5  3.5 - 5.1 mmol/L Final   Chloride 02/16/2023 100  98 - 111 mmol/L  Final   CO2 02/16/2023 27  22 - 32 mmol/L Final   Glucose, Bld 02/16/2023 112 (H)  70 -  99 mg/dL Final   Glucose reference range applies only to samples taken after fasting for at least 8 hours.   BUN 02/16/2023 16  8 - 23 mg/dL Final   Creatinine, Ser 02/16/2023 0.80  0.44 - 1.00 mg/dL Final   Calcium 16/10/9602 9.2  8.9 - 10.3 mg/dL Final   Total Protein 54/09/8117 7.8  6.5 - 8.1 g/dL Final   Albumin 14/78/2956 3.9  3.5 - 5.0 g/dL Final   AST 21/30/8657 23  15 - 41 U/L Final   ALT 02/16/2023 17  0 - 44 U/L Final   Alkaline Phosphatase 02/16/2023 54  38 - 126 U/L Final   Total Bilirubin 02/16/2023 0.6  0.0 - 1.2 mg/dL Final   GFR, Estimated 02/16/2023 >60  >60 mL/min Final   Comment: (NOTE) Calculated using the CKD-EPI Creatinine Equation (2021)    Anion gap 02/16/2023 9  5 - 15 Final   Performed at [facility], [address] [place], [place] [address]   Magnesium 02/16/2023 2.0  1.7 - 2.4 mg/dL Final   Performed at [facility], [address] [place], [place] [address]  Office Visit on 02/10/2023  Component Date Value Ref Range Status   MICRO NUMBER: 02/10/2023 [id number]   Final   SPECIMEN QUALITY: 02/10/2023 Adequate   Final   Source 02/10/2023 STOOL   Final   STATUS: 02/10/2023 FINAL   Final   GDH ANTIGEN 02/10/2023 Not Detected   Final   TOXIN A AND B 02/10/2023 Not Detected   Final   COMMENT 02/10/2023 No toxigenic C. difficile detected For additional information, please refer to http://education.[facility].com/faq/FAQ136 (This link is being provided for informational/educational purposes only.)   Final   CampyloBacter Group 02/10/2023 NOT DETECTED  NOT DETECTED Final   Salmonella species 02/10/2023 NOT DETECTED  NOT DETECTED Final   Shigella Species 02/10/2023 NOT DETECTED  NOT DETECTED Final   Vibrio Group 02/10/2023 NOT DETECTED  NOT DETECTED Final   Yersinia enterocolitica 02/10/2023 NOT DETECTED  NOT DETECTED Final   Shiga Toxin 1 02/10/2023 NOT DETECTED  NOT  DETECTED Final   Shiga Toxin 2 02/10/2023 NOT DETECTED  NOT DETECTED Final   Norovirus GI/GII 02/10/2023 NOT DETECTED  NOT DETECTED Final   Rotavirus A 02/10/2023 NOT DETECTED  NOT DETECTED Final   Comment: Organisms included in the Campylobacter group include C. coli, C. jejuni, and C. lari. Organisms included in the Shigella species include S. dysenteriae, S. boydii, S. sonnei, and S. flexneri. Organisms included in the Vibrio group include V. cholerae and V. parahaemolyticus.      X-Rays:DG Humerus Right Result Date: 03/20/2023 CLINICAL DATA:  Elective surgery EXAM: RIGHT HUMERUS - 2+ VIEW COMPARISON:  Right humerus x-ray 03/15/2023 FINDINGS: Eight intraoperative fluoroscopic views of the right humerus were provided. Right humeral intramedullary nail with distal and proximal interlocking screws placed fixating a mid humeral fracture. Alignment is anatomic. Fluoroscopy time: 94.5 seconds. Fluoroscopy dose: 5.01 micro gray. IMPRESSION: Intraoperative fluoroscopic views of the right humerus. Electronically Signed   By: Darliss Cheney M.D.   On: 03/20/2023 18:11   DG C-Arm 1-60 Min-No Report Result Date: 03/20/2023 Fluoroscopy was utilized by the requesting physician.  No radiographic interpretation.   DG C-Arm 1-60 Min-No Report Result Date: 03/20/2023 Fluoroscopy was utilized by the requesting physician.  No radiographic interpretation.   CT Head Wo Contrast Result Date: 03/15/2023 CLINICAL DATA:  Head trauma, minor (Age >= 65y); Neck trauma (Age >= 65y) EXAM: CT HEAD WITHOUT CONTRAST CT CERVICAL SPINE WITHOUT CONTRAST  TECHNIQUE: Multidetector CT imaging of the head and cervical spine was performed following the standard protocol without intravenous contrast. Multiplanar CT image reconstructions of the cervical spine were also generated. RADIATION DOSE REDUCTION: This exam was performed according to the departmental dose-optimization program which includes automated exposure control,  adjustment of the mA and/or kV according to patient size and/or use of iterative reconstruction technique. COMPARISON:  None Available. FINDINGS: CT HEAD FINDINGS Brain: No evidence of acute infarction, hemorrhage, hydrocephalus, extra-axial collection or mass lesion/mass effect. Vascular: No hyperdense vessel or unexpected calcification. Skull: No acute fracture. Sinuses/Orbits: Left maxillary sinus retention cyst. Otherwise, clear sinuses. No acute orbital findings. Other: No mastoid effusions. CT CERVICAL SPINE FINDINGS Alignment: No substantial sagittal subluxation. Skull base and vertebrae: C3-C6 ACDF. There is bony fusion at C3-C4 aided C4-C5. No evidence of bony fusion at C5-C6. Soft tissues and spinal canal: No prevertebral fluid or swelling. No visible canal hematoma. Disc levels: Multilevel degenerative change, likely greatest at C5-C6 with bilateral foraminal stenosis. Upper chest: Visualized lung apices are clear. IMPRESSION: 1. No evidence of acute abnormality intracranially or in the cervical spine. 2. C3-C6 ACDF. Solid bony fusion at C3-C5 without evidence of bony fusion at C5-C6. Electronically Signed   By: Feliberto Harts M.D.   On: 03/15/2023 16:14   CT Cervical Spine Wo Contrast Result Date: 03/15/2023 CLINICAL DATA:  Head trauma, minor (Age >= 65y); Neck trauma (Age >= 65y) EXAM: CT HEAD WITHOUT CONTRAST CT CERVICAL SPINE WITHOUT CONTRAST TECHNIQUE: Multidetector CT imaging of the head and cervical spine was performed following the standard protocol without intravenous contrast. Multiplanar CT image reconstructions of the cervical spine were also generated. RADIATION DOSE REDUCTION: This exam was performed according to the departmental dose-optimization program which includes automated exposure control, adjustment of the mA and/or kV according to patient size and/or use of iterative reconstruction technique. COMPARISON:  None Available. FINDINGS: CT HEAD FINDINGS Brain: No evidence of acute  infarction, hemorrhage, hydrocephalus, extra-axial collection or mass lesion/mass effect. Vascular: No hyperdense vessel or unexpected calcification. Skull: No acute fracture. Sinuses/Orbits: Left maxillary sinus retention cyst. Otherwise, clear sinuses. No acute orbital findings. Other: No mastoid effusions. CT CERVICAL SPINE FINDINGS Alignment: No substantial sagittal subluxation. Skull base and vertebrae: C3-C6 ACDF. There is bony fusion at C3-C4 aided C4-C5. No evidence of bony fusion at C5-C6. Soft tissues and spinal canal: No prevertebral fluid or swelling. No visible canal hematoma. Disc levels: Multilevel degenerative change, likely greatest at C5-C6 with bilateral foraminal stenosis. Upper chest: Visualized lung apices are clear. IMPRESSION: 1. No evidence of acute abnormality intracranially or in the cervical spine. 2. C3-C6 ACDF. Solid bony fusion at C3-C5 without evidence of bony fusion at C5-C6. Electronically Signed   By: Feliberto Harts M.D.   On: 03/15/2023 16:14   DG Shoulder Right Result Date: 03/15/2023 CLINICAL DATA:  Suspected dislocation.  Fall off porch. EXAM: RIGHT SHOULDER - 2+ VIEW COMPARISON:  None Available. FINDINGS: There is a transverse displaced fracture through the proximal humeral shaft. Apex anterior angulation. No frank glenohumeral dislocation. Superior subluxation of the humeral head typically due to chronic rotator cuff arthropathy. Acromioclavicular joint is congruent with mild degenerative change. No fracture of included ribs. IMPRESSION: 1. Transverse displaced proximal humeral shaft fracture with angulation. 2. No glenohumeral dislocation. Superior subluxation of the humeral head is typical of chronic rotator cuff arthropathy. Electronically Signed   By: Narda Rutherford M.D.   On: 03/15/2023 15:13    EKG: Orders placed or performed during the hospital  encounter of 02/16/23   ED EKG   ED EKG   EKG 12-Lead   EKG 12-Lead   EKG   EKG     Hospital Course:  Trinna Kunst is a 83 y.o. who was admitted to Hospital. They were brought to the operating room on 03/20/2023 and underwent Procedure(s): OPEN REDUCTION INTERNAL FIXATION (ORIF) PROXIMAL HUMERUS FRACTURE.  Patient tolerated the procedure well and was later transferred to the recovery room and then to the orthopaedic floor for postoperative care.  They were given PO and IV analgesics for pain control following their surgery.  They were given 24 hours of postoperative antibiotics of  Anti-infectives (From admission, onward)    Start     Dose/Rate Route Frequency Ordered Stop   03/21/23 0400  vancomycin (VANCOCIN) IVPB 1000 mg/200 mL premix        1,000 mg 200 mL/hr over 60 Minutes Intravenous Every 12 hours 03/20/23 1711 03/21/23 0537   03/20/23 1500  vancomycin (VANCOCIN) IVPB 1000 mg/200 mL premix        1,000 mg 200 mL/hr over 60 Minutes Intravenous To Surgery 03/20/23 1109 03/20/23 1638      and started on DVT prophylaxis in the form of Aspirin.   OT was orderedl.  Discharge planning consulted to help with postop disposition and equipment needs.  Patient had an uneventful  night on the evening of surgery.  They started to get up OOB with therapy on day one.  Patient was seen in rounds and was ready to go home.   Diet: Regular diet Activity:PWB , no lifting more than 2 pounds Follow-up:in 2 weeks Disposition - Home Discharged Condition: good   Discharge Instructions     Call MD / Call 911   Complete by: As directed    If you experience chest pain or shortness of breath, CALL 911 and be transported to the hospital emergency room.  If you develope a fever above 101 F, pus (white drainage) or increased drainage or redness at the wound, or calf pain, call your surgeon's office.   Constipation Prevention   Complete by: As directed    Drink plenty of fluids.  Prune juice may be helpful.  You may use a stool softener, such as Colace (over the counter) 100 mg twice a day.  Use MiraLax (over  the counter) for constipation as needed.   Diet - low sodium heart healthy   Complete by: As directed    Post-operative opioid taper instructions:   Complete by: As directed    POST-OPERATIVE OPIOID TAPER INSTRUCTIONS: It is important to wean off of your opioid medication as soon as possible. If you do not need pain medication after your surgery it is ok to stop day one. Opioids include: Codeine, Hydrocodone(Norco, Vicodin), Oxycodone(Percocet, oxycontin) and hydromorphone amongst others.  Long term and even short term use of opiods can cause: Increased pain response Dependence Constipation Depression Respiratory depression And more.  Withdrawal symptoms can include Flu like symptoms Nausea, vomiting And more Techniques to manage these symptoms Hydrate well Eat regular healthy meals Stay active Use relaxation techniques(deep breathing, meditating, yoga) Do Not substitute Alcohol to help with tapering If you have been on opioids for less than two weeks and do not have pain than it is ok to stop all together.  Plan to wean off of opioids This plan should start within one week post op of your joint replacement. Maintain the same interval or time between taking each dose and first decrease the  dose.  Cut the total daily intake of opioids by one tablet each day Next start to increase the time between doses. The last dose that should be eliminated is the evening dose.         Allergies as of 03/21/2023       Reactions   Codeine Nausea And Vomiting   Demerol [meperidine Hcl] Nausea And Vomiting   Penicillins Anaphylaxis   Propoxyphene Nausea And Vomiting        Medication List     STOP taking these medications    oxyCODONE-acetaminophen 5-325 MG tablet Commonly known as: PERCOCET/ROXICET       TAKE these medications    acetaminophen 500 MG tablet Commonly known as: TYLENOL Take 500 mg by mouth as needed for mild pain (pain score 1-3).   ALPRAZolam 0.25 MG  tablet Commonly known as: XANAX Take 0.25 mg by mouth daily as needed for anxiety.   aspirin EC 81 MG tablet Take 81 mg by mouth daily.   diphenoxylate-atropine 2.5-0.025 MG tablet Commonly known as: Lomotil Take 1-2 tablets by mouth 4 (four) times daily as needed for diarrhea or loose stools.   DULoxetine 20 MG capsule Commonly known as: CYMBALTA Take 20 mg by mouth 2 (two) times daily.   levothyroxine 75 MCG tablet Commonly known as: SYNTHROID Take 75 mcg by mouth daily before breakfast.   lisinopril 10 MG tablet Commonly known as: ZESTRIL Take 10 mg by mouth daily.   omeprazole 40 MG capsule Commonly known as: PRILOSEC Take 40 mg by mouth in the morning.   ondansetron 4 MG disintegrating tablet Commonly known as: ZOFRAN-ODT Take 1 tablet (4 mg total) by mouth every 8 (eight) hours as needed for vomiting or nausea.   oxyCODONE 5 MG immediate release tablet Commonly known as: Roxicodone Take 1 tablet (5 mg total) by mouth every 6 (six) hours as needed for severe pain (pain score 7-10) or moderate pain (pain score 4-6). What changed: reasons to take this   Vitamin D3 50 MCG (2000 UT) capsule Take 2,000 Units by mouth daily.   Voltaren Arthritis Pain 1 % Gel Generic drug: diclofenac Sodium Apply 2 g topically daily as needed (Pain).        Follow-up Information     Yolonda Kida, MD Follow up in 2 week(s).   Specialty: Orthopedic Surgery Contact information: [address] [place] [address] [phone]         [facility] Follow up.   Why: [facility] will deliver the 3-in-1 bedside commode. Contact information: [address] [place] [address] [phone]         [facility], [facility] .                  Signed: Dion Saucier PA-C Orthopaedic Surgery 03/24/2023, 9:11 AM

## 2023-05-05 ENCOUNTER — Other Ambulatory Visit (HOSPITAL_COMMUNITY): Payer: Self-pay | Admitting: Adult Health Nurse Practitioner

## 2023-05-05 DIAGNOSIS — Z1231 Encounter for screening mammogram for malignant neoplasm of breast: Secondary | ICD-10-CM

## 2023-05-12 ENCOUNTER — Ambulatory Visit (INDEPENDENT_AMBULATORY_CARE_PROVIDER_SITE_OTHER): Payer: 59 | Admitting: Gastroenterology

## 2023-05-18 ENCOUNTER — Ambulatory Visit (HOSPITAL_COMMUNITY)
Admission: RE | Admit: 2023-05-18 | Discharge: 2023-05-18 | Disposition: A | Discharge status: Home / Self Care | Source: Ambulatory Visit | Attending: Adult Health Nurse Practitioner | Admitting: Adult Health Nurse Practitioner

## 2023-05-18 ENCOUNTER — Encounter (HOSPITAL_COMMUNITY): Payer: Self-pay

## 2023-05-18 DIAGNOSIS — Z1231 Encounter for screening mammogram for malignant neoplasm of breast: Secondary | ICD-10-CM | POA: Diagnosis present
